# Patient Record
Sex: Female | Born: 1950 | Race: Black or African American | Hispanic: No | Smoking: Former smoker
Health system: Southern US, Community
[De-identification: ages and names within clinical notes are randomized; demographics above are authoritative.]

## PROBLEM LIST (undated history)

## (undated) DIAGNOSIS — I10 Essential (primary) hypertension: Secondary | ICD-10-CM

## (undated) DIAGNOSIS — M1990 Unspecified osteoarthritis, unspecified site: Secondary | ICD-10-CM

## (undated) DIAGNOSIS — E119 Type 2 diabetes mellitus without complications: Secondary | ICD-10-CM

---

## 2006-10-26 ENCOUNTER — Ambulatory Visit: Payer: Self-pay | Admitting: Family Medicine

## 2006-11-18 ENCOUNTER — Ambulatory Visit: Payer: Self-pay | Admitting: Family Medicine

## 2006-12-02 ENCOUNTER — Ambulatory Visit: Payer: Self-pay | Admitting: Family Medicine

## 2006-12-23 ENCOUNTER — Ambulatory Visit: Payer: Self-pay | Admitting: Family Medicine

## 2007-01-20 ENCOUNTER — Ambulatory Visit: Payer: Self-pay | Admitting: Family Medicine

## 2007-04-20 ENCOUNTER — Ambulatory Visit: Payer: Self-pay | Admitting: Family Medicine

## 2008-04-03 ENCOUNTER — Ambulatory Visit (HOSPITAL_COMMUNITY): Admission: RE | Admit: 2008-04-03 | Discharge: 2008-04-03 | Payer: Self-pay | Admitting: Family Medicine

## 2009-04-05 ENCOUNTER — Ambulatory Visit (HOSPITAL_COMMUNITY): Admission: RE | Admit: 2009-04-05 | Discharge: 2009-04-05 | Payer: Self-pay | Admitting: Family Medicine

## 2010-12-15 ENCOUNTER — Encounter: Payer: Self-pay | Admitting: Family Medicine

## 2010-12-16 ENCOUNTER — Encounter: Payer: Self-pay | Admitting: Family Medicine

## 2012-12-03 ENCOUNTER — Other Ambulatory Visit (HOSPITAL_COMMUNITY): Payer: Self-pay | Admitting: Family Medicine

## 2012-12-03 DIAGNOSIS — Z139 Encounter for screening, unspecified: Secondary | ICD-10-CM

## 2012-12-07 ENCOUNTER — Ambulatory Visit (HOSPITAL_COMMUNITY)
Admission: RE | Admit: 2012-12-07 | Discharge: 2012-12-07 | Disposition: A | Discharge status: Home / Self Care | Payer: Medicaid Other | Source: Ambulatory Visit | Attending: Family Medicine | Admitting: Family Medicine

## 2012-12-07 DIAGNOSIS — Z139 Encounter for screening, unspecified: Secondary | ICD-10-CM

## 2012-12-07 DIAGNOSIS — Z1231 Encounter for screening mammogram for malignant neoplasm of breast: Secondary | ICD-10-CM | POA: Insufficient documentation

## 2013-03-22 ENCOUNTER — Encounter (INDEPENDENT_AMBULATORY_CARE_PROVIDER_SITE_OTHER): Payer: Self-pay | Admitting: *Deleted

## 2013-03-22 ENCOUNTER — Encounter (INDEPENDENT_AMBULATORY_CARE_PROVIDER_SITE_OTHER): Payer: Self-pay

## 2013-11-29 ENCOUNTER — Other Ambulatory Visit (HOSPITAL_COMMUNITY): Payer: Self-pay | Admitting: Family Medicine

## 2013-11-29 DIAGNOSIS — Z139 Encounter for screening, unspecified: Secondary | ICD-10-CM

## 2013-12-08 ENCOUNTER — Ambulatory Visit (HOSPITAL_COMMUNITY)
Admission: RE | Admit: 2013-12-08 | Discharge: 2013-12-08 | Disposition: A | Discharge status: Home / Self Care | Payer: Medicaid Other | Source: Ambulatory Visit | Attending: Family Medicine | Admitting: Family Medicine

## 2013-12-08 DIAGNOSIS — Z1231 Encounter for screening mammogram for malignant neoplasm of breast: Secondary | ICD-10-CM | POA: Insufficient documentation

## 2013-12-08 DIAGNOSIS — Z139 Encounter for screening, unspecified: Secondary | ICD-10-CM

## 2014-05-01 DIAGNOSIS — R195 Other fecal abnormalities: Secondary | ICD-10-CM | POA: Insufficient documentation

## 2015-09-24 DIAGNOSIS — E1169 Type 2 diabetes mellitus with other specified complication: Secondary | ICD-10-CM | POA: Insufficient documentation

## 2015-09-24 DIAGNOSIS — K219 Gastro-esophageal reflux disease without esophagitis: Secondary | ICD-10-CM | POA: Insufficient documentation

## 2015-09-24 DIAGNOSIS — E785 Hyperlipidemia, unspecified: Secondary | ICD-10-CM | POA: Insufficient documentation

## 2015-09-24 DIAGNOSIS — M1991 Primary osteoarthritis, unspecified site: Secondary | ICD-10-CM | POA: Insufficient documentation

## 2015-12-31 ENCOUNTER — Encounter (HOSPITAL_COMMUNITY): Payer: Self-pay | Admitting: *Deleted

## 2015-12-31 ENCOUNTER — Emergency Department (HOSPITAL_COMMUNITY)
Admission: EM | Admit: 2015-12-31 | Discharge: 2015-12-31 | Disposition: A | Discharge status: Home / Self Care | Payer: No Typology Code available for payment source | Attending: Emergency Medicine | Admitting: Emergency Medicine

## 2015-12-31 DIAGNOSIS — E119 Type 2 diabetes mellitus without complications: Secondary | ICD-10-CM | POA: Insufficient documentation

## 2015-12-31 DIAGNOSIS — Y92410 Unspecified street and highway as the place of occurrence of the external cause: Secondary | ICD-10-CM | POA: Insufficient documentation

## 2015-12-31 DIAGNOSIS — Y998 Other external cause status: Secondary | ICD-10-CM | POA: Insufficient documentation

## 2015-12-31 DIAGNOSIS — Z7951 Long term (current) use of inhaled steroids: Secondary | ICD-10-CM | POA: Insufficient documentation

## 2015-12-31 DIAGNOSIS — S3992XA Unspecified injury of lower back, initial encounter: Secondary | ICD-10-CM | POA: Diagnosis present

## 2015-12-31 DIAGNOSIS — M1990 Unspecified osteoarthritis, unspecified site: Secondary | ICD-10-CM | POA: Insufficient documentation

## 2015-12-31 DIAGNOSIS — S39012A Strain of muscle, fascia and tendon of lower back, initial encounter: Secondary | ICD-10-CM | POA: Diagnosis not present

## 2015-12-31 DIAGNOSIS — Z7984 Long term (current) use of oral hypoglycemic drugs: Secondary | ICD-10-CM | POA: Insufficient documentation

## 2015-12-31 DIAGNOSIS — Y9389 Activity, other specified: Secondary | ICD-10-CM | POA: Insufficient documentation

## 2015-12-31 DIAGNOSIS — Z791 Long term (current) use of non-steroidal anti-inflammatories (NSAID): Secondary | ICD-10-CM | POA: Insufficient documentation

## 2015-12-31 DIAGNOSIS — R011 Cardiac murmur, unspecified: Secondary | ICD-10-CM | POA: Diagnosis not present

## 2015-12-31 DIAGNOSIS — Z79899 Other long term (current) drug therapy: Secondary | ICD-10-CM | POA: Diagnosis not present

## 2015-12-31 DIAGNOSIS — I10 Essential (primary) hypertension: Secondary | ICD-10-CM | POA: Diagnosis not present

## 2015-12-31 HISTORY — DX: Unspecified osteoarthritis, unspecified site: M19.90

## 2015-12-31 HISTORY — DX: Type 2 diabetes mellitus without complications: E11.9

## 2015-12-31 HISTORY — DX: Essential (primary) hypertension: I10

## 2015-12-31 MED ORDER — METHOCARBAMOL 500 MG PO TABS: ORAL_TABLET | ORAL | Status: DC

## 2015-12-31 MED ORDER — HYDROCODONE-ACETAMINOPHEN 5-325 MG PO TABS: ORAL_TABLET | ORAL | Status: DC
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
## 2015-12-31 NOTE — ED Notes (Signed)
Pt verbalized understanding of no driving and to use caution within 4 hours of taking pain meds due to meds cause drowsiness 
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
## 2016-02-18 ENCOUNTER — Other Ambulatory Visit (HOSPITAL_COMMUNITY): Payer: Self-pay | Admitting: Physician Assistant

## 2016-02-18 ENCOUNTER — Ambulatory Visit (HOSPITAL_COMMUNITY)
Admission: RE | Admit: 2016-02-18 | Discharge: 2016-02-18 | Disposition: A | Discharge status: Home / Self Care | Payer: Medicaid Other | Source: Ambulatory Visit | Attending: Physician Assistant | Admitting: Physician Assistant

## 2016-02-18 DIAGNOSIS — M47896 Other spondylosis, lumbar region: Secondary | ICD-10-CM | POA: Insufficient documentation

## 2016-02-18 DIAGNOSIS — M5442 Lumbago with sciatica, left side: Secondary | ICD-10-CM | POA: Diagnosis not present

## 2016-02-18 DIAGNOSIS — M5441 Lumbago with sciatica, right side: Secondary | ICD-10-CM

## 2016-02-19 DIAGNOSIS — Z1211 Encounter for screening for malignant neoplasm of colon: Secondary | ICD-10-CM | POA: Insufficient documentation

## 2016-03-09 ENCOUNTER — Emergency Department (HOSPITAL_COMMUNITY): Payer: Medicaid Other

## 2016-03-09 ENCOUNTER — Encounter (HOSPITAL_COMMUNITY): Payer: Self-pay | Admitting: Emergency Medicine

## 2016-03-09 ENCOUNTER — Emergency Department (HOSPITAL_COMMUNITY)
Admission: EM | Admit: 2016-03-09 | Discharge: 2016-03-09 | Disposition: A | Discharge status: Home / Self Care | Payer: Medicaid Other | Attending: Emergency Medicine | Admitting: Emergency Medicine

## 2016-03-09 DIAGNOSIS — M1990 Unspecified osteoarthritis, unspecified site: Secondary | ICD-10-CM | POA: Diagnosis not present

## 2016-03-09 DIAGNOSIS — Z7984 Long term (current) use of oral hypoglycemic drugs: Secondary | ICD-10-CM | POA: Diagnosis not present

## 2016-03-09 DIAGNOSIS — R1032 Left lower quadrant pain: Secondary | ICD-10-CM | POA: Diagnosis present

## 2016-03-09 DIAGNOSIS — E119 Type 2 diabetes mellitus without complications: Secondary | ICD-10-CM | POA: Insufficient documentation

## 2016-03-09 DIAGNOSIS — Z79899 Other long term (current) drug therapy: Secondary | ICD-10-CM | POA: Diagnosis not present

## 2016-03-09 DIAGNOSIS — I10 Essential (primary) hypertension: Secondary | ICD-10-CM | POA: Insufficient documentation

## 2016-03-09 LAB — URINALYSIS, ROUTINE W REFLEX MICROSCOPIC
BILIRUBIN URINE: NEGATIVE
Glucose, UA: NEGATIVE mg/dL
Ketones, ur: NEGATIVE mg/dL
Leukocytes, UA: NEGATIVE
NITRITE: NEGATIVE
PH: 5.5 (ref 5.0–8.0)
PROTEIN: NEGATIVE mg/dL

## 2016-03-09 LAB — CBC
HEMATOCRIT: 37 % (ref 36.0–46.0)
HEMOGLOBIN: 12.2 g/dL (ref 12.0–15.0)
MCH: 27.9 pg (ref 26.0–34.0)
MCHC: 33 g/dL (ref 30.0–36.0)
MCV: 84.7 fL (ref 78.0–100.0)
Platelets: 232 10*3/uL (ref 150–400)
RBC: 4.37 MIL/uL (ref 3.87–5.11)
RDW: 13.4 % (ref 11.5–15.5)
WBC: 5.1 10*3/uL (ref 4.0–10.5)

## 2016-03-09 LAB — URINE MICROSCOPIC-ADD ON: WBC UA: NONE SEEN WBC/hpf (ref 0–5)

## 2016-03-09 LAB — LIPASE, BLOOD: Lipase: 26 U/L (ref 11–51)

## 2016-03-09 LAB — I-STAT CHEM 8, ED
BUN: 23 mg/dL — ABNORMAL HIGH (ref 6–20)
CHLORIDE: 105 mmol/L (ref 101–111)
Calcium, Ion: 1.27 mmol/L (ref 1.13–1.30)
Creatinine, Ser: 0.8 mg/dL (ref 0.44–1.00)
Glucose, Bld: 128 mg/dL — ABNORMAL HIGH (ref 65–99)
HEMATOCRIT: 42 % (ref 36.0–46.0)
Hemoglobin: 14.3 g/dL (ref 12.0–15.0)
POTASSIUM: 3.9 mmol/L (ref 3.5–5.1)
SODIUM: 143 mmol/L (ref 135–145)
TCO2: 25 mmol/L (ref 0–100)

## 2016-03-09 LAB — COMPREHENSIVE METABOLIC PANEL
ALBUMIN: 4.1 g/dL (ref 3.5–5.0)
ALT: 28 U/L (ref 14–54)
ANION GAP: 7 (ref 5–15)
AST: 28 U/L (ref 15–41)
Alkaline Phosphatase: 50 U/L (ref 38–126)
BUN: 23 mg/dL — ABNORMAL HIGH (ref 6–20)
CHLORIDE: 108 mmol/L (ref 101–111)
CO2: 26 mmol/L (ref 22–32)
Calcium: 9.4 mg/dL (ref 8.9–10.3)
Creatinine, Ser: 0.8 mg/dL (ref 0.44–1.00)
GFR calc Af Amer: >60 mL/min (ref >60)
GFR calc non Af Amer: >60 mL/min (ref >60)
GLUCOSE: 137 mg/dL — AB (ref 65–99)
POTASSIUM: 3.9 mmol/L (ref 3.5–5.1)
SODIUM: 141 mmol/L (ref 135–145)
Total Bilirubin: 0.4 mg/dL (ref 0.3–1.2)
Total Protein: 7.5 g/dL (ref 6.5–8.1)

## 2016-03-09 MED ORDER — DIATRIZOATE MEGLUMINE & SODIUM 66-10 % PO SOLN
ORAL | Status: AC
  Administered 2016-03-09: 30 mL
  Filled 2016-03-09: qty 30

## 2016-03-09 MED ORDER — KETOROLAC TROMETHAMINE 60 MG/2ML IM SOLN
30.0000 mg | Freq: Once | INTRAMUSCULAR | Status: DC
  Filled 2016-03-09: qty 2

## 2016-03-09 MED ORDER — IOPAMIDOL (ISOVUE-300) INJECTION 61%
100.0000 mL | Freq: Once | INTRAVENOUS | Status: AC | PRN
  Administered 2016-03-09: 100 mL via INTRAVENOUS

## 2016-03-09 MED ORDER — KETOROLAC TROMETHAMINE 30 MG/ML IJ SOLN
15.0000 mg | Freq: Once | INTRAMUSCULAR | Status: AC
  Administered 2016-03-09: 15 mg via INTRAVENOUS

## 2016-03-09 NOTE — ED Provider Notes (Signed)
CSN: [id number]     Arrival date & time 03/09/16  1403 History   First MD Initiated Contact with Patient 03/09/16 1410     Chief Complaint  Patient presents with  . Abdominal Pain     (Consider location/radiation/quality/duration/timing/severity/associated sxs/prior Treatment) Patient is a 65 y.o. female presenting with abdominal pain.  Abdominal Pain Pain location:  LLQ Pain quality: aching and sharp   Pain radiates to:  Suprapubic region Pain severity:  Mild Onset quality:  Gradual Duration:  1 week Timing:  Constant Progression:  Worsening Chronicity:  New Relieved by:  Not moving Worsened by:  Movement Ineffective treatments:  None tried Associated symptoms: constipation and dysuria   Associated symptoms: no cough and no shortness of breath     Past Medical History  Diagnosis Date  . Hypertension   . Diabetes mellitus without complication (HCC)   . Arthritis    History reviewed. No pertinent past surgical history. History reviewed. No pertinent family history. Social History  Substance Use Topics  . Smoking status: Never Smoker   . Smokeless tobacco: None  . Alcohol Use: No   OB History    No data available     Review of Systems  Respiratory: Negative for cough and shortness of breath.   Gastrointestinal: Positive for abdominal pain and constipation.  Genitourinary: Positive for dysuria.  All other systems reviewed and are negative.     Allergies  Review of patient's allergies indicates no known allergies.  Home Medications   Prior to Admission medications   Medication Sig Start Date End Date Taking? Authorizing Provider  acetaminophen (TYLENOL) 500 MG tablet Take 500 mg by mouth every 6 (six) hours as needed for mild pain or moderate pain.   Yes Historical Provider, MD  amLODipine (NORVASC) 5 MG tablet Take 5 mg by mouth daily.  09/24/15  Yes Historical Provider, MD  atorvastatin (LIPITOR) 20 MG tablet Take 20 mg by mouth every evening.  09/24/15   Yes Historical Provider, MD  colchicine 0.6 MG tablet Take 0.6 mg by mouth daily.  09/24/15  Yes Historical Provider, MD  fluticasone (FLONASE) 50 MCG/ACT nasal spray Place into the nose daily.    Yes Historical Provider, MD  lisinopril-hydrochlorothiazide (PRINZIDE,ZESTORETIC) 20-25 MG tablet Take 1 tablet by mouth daily.  09/24/15  Yes Historical Provider, MD  meloxicam (MOBIC) 7.5 MG tablet Take 7.5 mg by mouth daily.  09/24/15  Yes Historical Provider, MD  metFORMIN (GLUCOPHAGE-XR) 500 MG 24 hr tablet Take 500 mg by mouth daily with breakfast.  09/24/15  Yes Historical Provider, MD  methocarbamol (ROBAXIN) 500 MG tablet 1 at hs or three times daily for spasm pain Patient taking differently: Take 500 mg by mouth at bedtime.  12/31/15  Yes Ivery Quale, PA-C  metoprolol succinate (TOPROL-XL) 25 MG 24 hr tablet Take 25 mg by mouth daily.  09/24/15  Yes Historical Provider, MD  omeprazole (PRILOSEC) 20 MG capsule Take 20 mg by mouth daily.  09/24/15  Yes Historical Provider, MD   BP 180/99 mmHg  Pulse 74  Temp(Src) 97.7 F (36.5 C) (Oral)  Resp 16  Ht  (1.6 m)  Wt 220 lb (99.791 kg)  BMI 38.98 kg/m2  SpO2 100% Physical Exam  Constitutional: She is oriented to person, place, and time. She appears well-developed and well-nourished.  HENT:  Head: Normocephalic and atraumatic.  Neck: Normal range of motion.  Cardiovascular: Normal rate and regular rhythm.  Exam reveals no friction rub.   No murmur heard. Pulmonary/Chest:  No stridor. No respiratory distress.  Abdominal: She exhibits no distension. There is tenderness (llq).  Musculoskeletal: Normal range of motion. She exhibits no edema or tenderness.  Neurological: She is alert and oriented to person, place, and time.  Skin: Skin is warm and dry.  Nursing note and vitals reviewed.   ED Course  Procedures (including critical care time) Labs Review Labs Reviewed  COMPREHENSIVE METABOLIC PANEL - Abnormal; Notable for the  following:    Glucose, Bld 137 (*)    BUN 23 (*)    All other components within normal limits  URINALYSIS, ROUTINE W REFLEX MICROSCOPIC (NOT [facility]) - Abnormal; Notable for the following:    Specific Gravity, Urine >1.030 (*)    Hgb urine dipstick TRACE (*)    All other components within normal limits  URINE MICROSCOPIC-ADD ON - Abnormal; Notable for the following:    Squamous Epithelial / LPF 6-30 (*)    Bacteria, UA RARE (*)    All other components within normal limits  I-STAT CHEM 8, ED - Abnormal; Notable for the following:    BUN 23 (*)    Glucose, Bld 128 (*)    All other components within normal limits  LIPASE, BLOOD  CBC    Imaging Review Ct Abdomen Pelvis W Contrast  03/09/2016  CLINICAL DATA:  Lower left-sided abdominal pain for 3 days. Dysuria. Hypertension and diabetes. EXAM: CT ABDOMEN AND PELVIS WITH CONTRAST TECHNIQUE: Multidetector CT imaging of the abdomen and pelvis was performed using the standard protocol following bolus administration of intravenous contrast. CONTRAST:  100mL ISOVUE-300 IOPAMIDOL (ISOVUE-300) INJECTION 61% COMPARISON:  None. FINDINGS: Lower chest: Clear lung bases. Mild cardiomegaly, without pericardial or pleural effusion. Hepatobiliary: Hepatic steatosis, without focal liver lesion. Normal gallbladder, without biliary ductal dilatation. Pancreas: Normal, without mass or ductal dilatation. Spleen: Normal in size, without focal abnormality. Adrenals/Urinary Tract: Normal adrenal glands. Normal kidneys, without hydronephrosis. Normal urinary bladder. Stomach/Bowel: Normal stomach, without Kmetz thickening. Scattered colonic diverticula. Normal terminal ileum and appendix. Normal small bowel. Vascular/Lymphatic: Normal caliber of the aorta and branch vessels. No abdominopelvic adenopathy. Reproductive: Multiple uterine fibroids. Example within the posterior uterine body at 3.8 cm. Limited evaluation for adnexal mass secondary to exophytic fibroids. Other: No  significant free fluid. Musculoskeletal: No acute osseous abnormality. Thoracolumbar junction degenerative disc disease. IMPRESSION: 1.  No acute process in the abdomen or pelvis. 2. Uterine fibroids. 3. Hepatic steatosis. Electronically Signed   By: Jeronimo GreavesKyle  Talbot M.D.   On: 03/09/2016 17:14   I have personally reviewed and evaluated these images and lab results as part of my medical decision-making.   EKG Interpretation None      MDM   Final diagnoses:  Left lower quadrant pain    Workup negative. Pain improved. Possibly muscular in nature. However without pain now, will not rx outpatient meds and ask her to fu w/ pcp if worsens again.   New Prescriptions: Discharge Medication List as of 03/09/2016  5:57 PM       I have personally and contemperaneously reviewed labs and imaging and used in my decision making as above.   A medical screening exam was performed and I feel the patient has had an appropriate workup for their chief complaint at this time and likelihood of emergent condition existing is low. Their vital signs are stable. They have been counseled on decision, discharge, follow up and which symptoms necessitate immediate return to the emergency department.  They verbally stated understanding and agreement with plan and  discharged in stable condition.    Marily Memos, MD 03/09/16 2227

## 2016-03-09 NOTE — ED Notes (Signed)
Pt presents to ED with complaints of lower left abdominal pain x 3 days.  States that burning is noted when urinating.  Denies N/V/D.

## 2016-03-27 DIAGNOSIS — Z1239 Encounter for other screening for malignant neoplasm of breast: Secondary | ICD-10-CM | POA: Insufficient documentation

## 2016-03-27 DIAGNOSIS — E1169 Type 2 diabetes mellitus with other specified complication: Secondary | ICD-10-CM | POA: Insufficient documentation

## 2016-03-28 ENCOUNTER — Other Ambulatory Visit (HOSPITAL_COMMUNITY): Payer: Self-pay | Admitting: Adult Health Nurse Practitioner

## 2016-03-28 DIAGNOSIS — Z1231 Encounter for screening mammogram for malignant neoplasm of breast: Secondary | ICD-10-CM

## 2016-03-31 ENCOUNTER — Ambulatory Visit (HOSPITAL_COMMUNITY)
Admission: RE | Admit: 2016-03-31 | Discharge: 2016-03-31 | Disposition: A | Discharge status: Home / Self Care | Payer: Medicaid Other | Source: Ambulatory Visit | Attending: Adult Health Nurse Practitioner | Admitting: Adult Health Nurse Practitioner

## 2016-03-31 DIAGNOSIS — Z1231 Encounter for screening mammogram for malignant neoplasm of breast: Secondary | ICD-10-CM | POA: Diagnosis present

## 2017-04-14 ENCOUNTER — Other Ambulatory Visit (HOSPITAL_COMMUNITY): Payer: Self-pay | Admitting: Physician Assistant

## 2017-04-14 DIAGNOSIS — Z1231 Encounter for screening mammogram for malignant neoplasm of breast: Secondary | ICD-10-CM

## 2017-04-16 IMAGING — MG MM DIGITAL SCREENING
6 of 10 series · 6 of 26 positions shown · non-contrast
Comparison: Previous exam(s).

CLINICAL DATA: Screening.

EXAM:
2D DIGITAL SCREENING BILATERAL MAMMOGRAM WITH CAD AND ADJUNCT TOMO

[R MLO (1 of 2)]
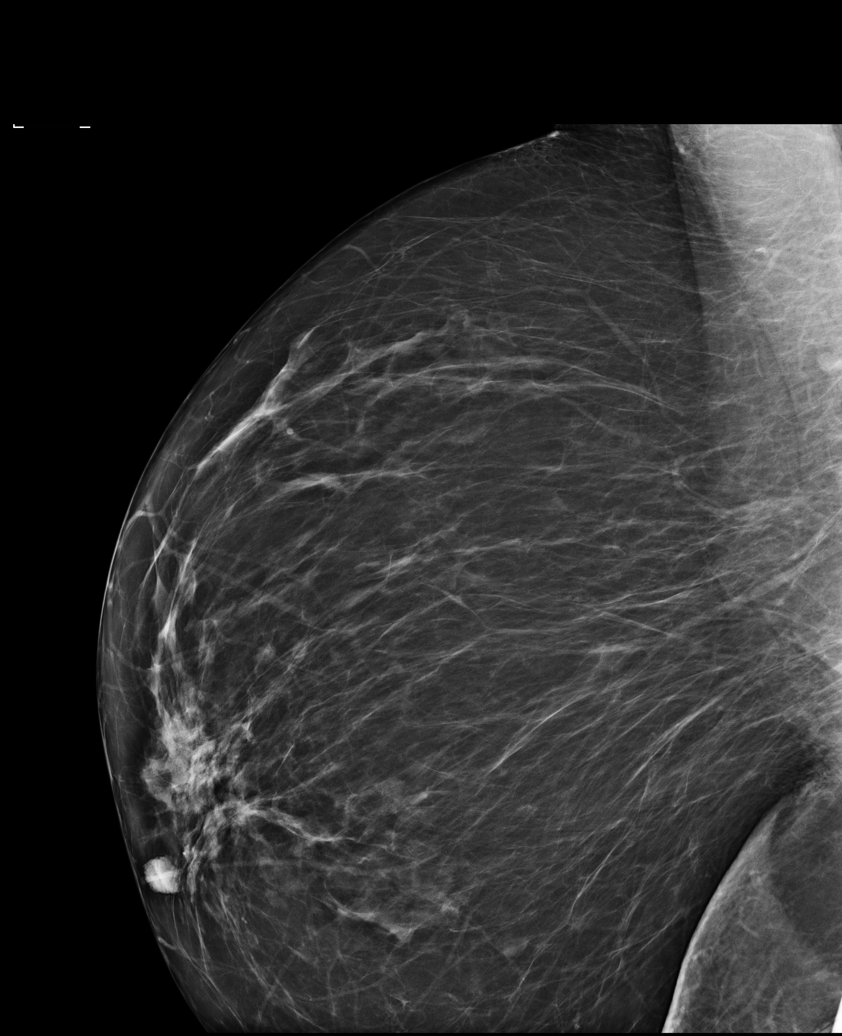

[L MLO (1 of 2)]
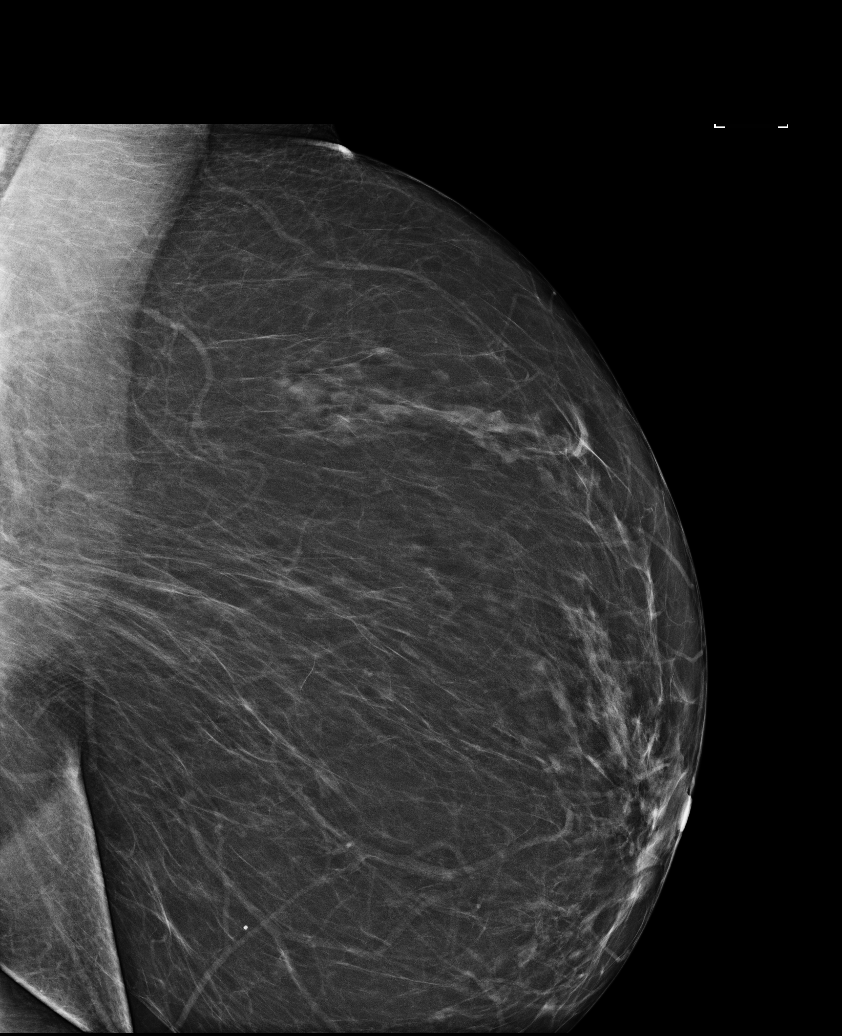

[R CC]
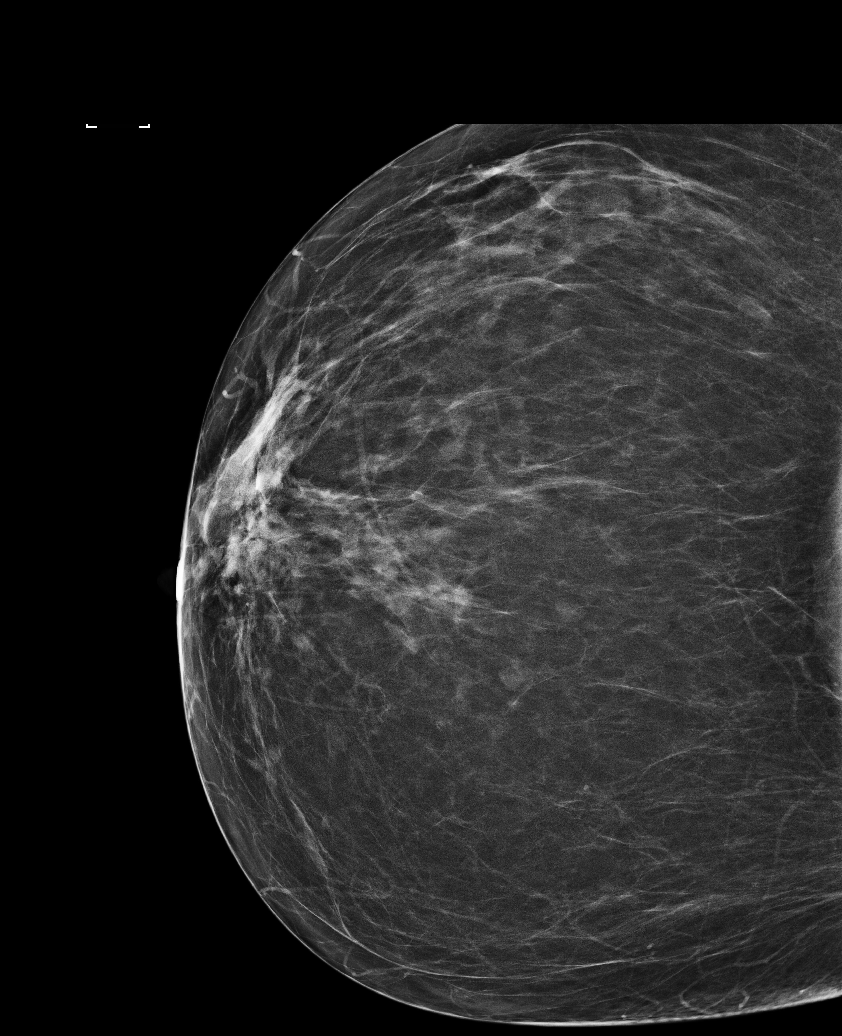

[L CC]
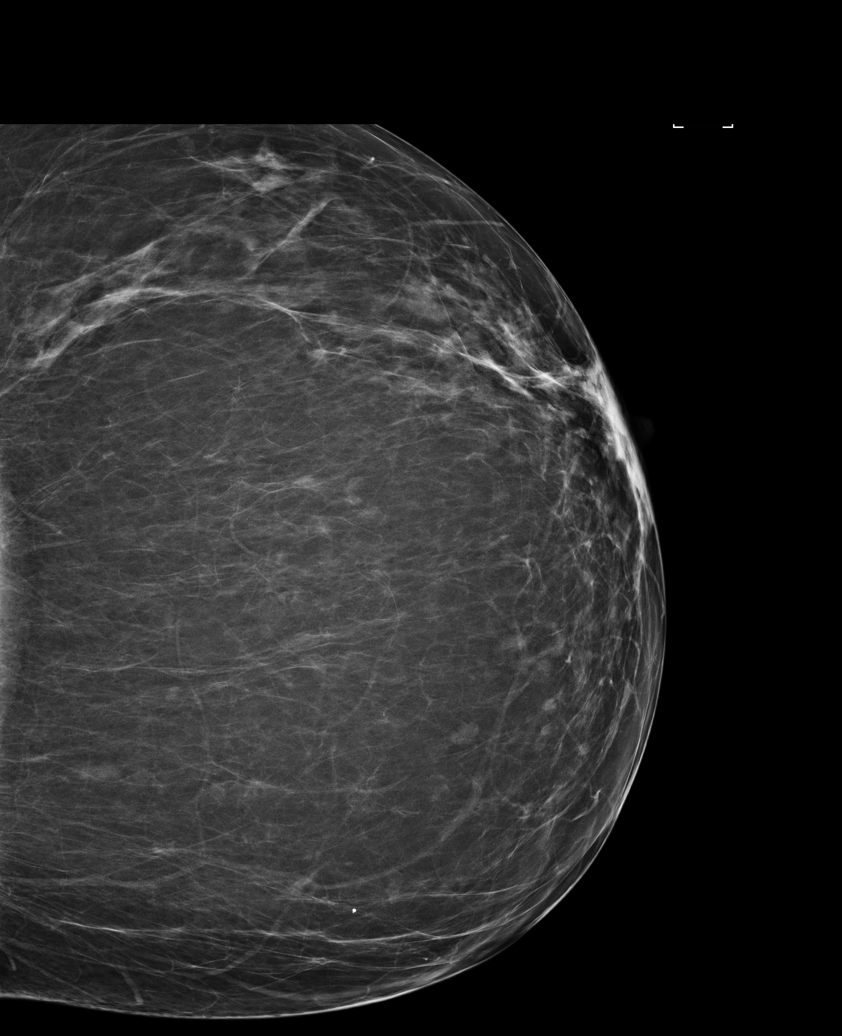

[R MLO (2 of 2)]
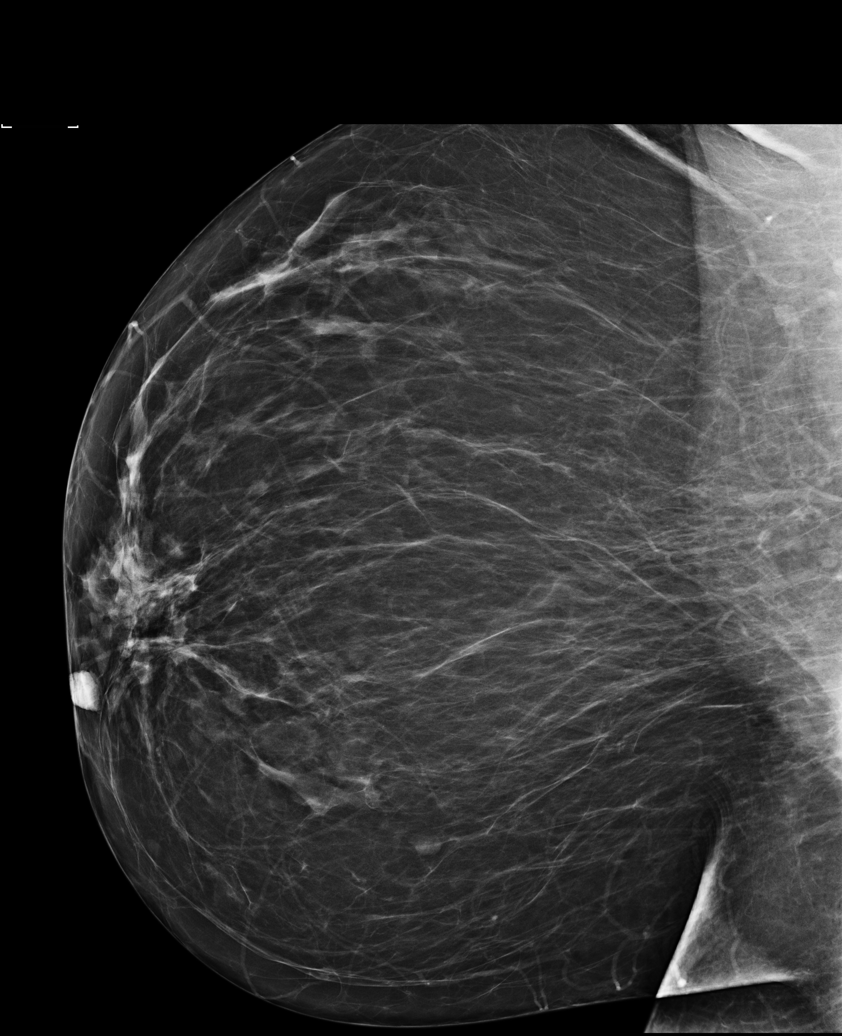

[L MLO (2 of 2)]
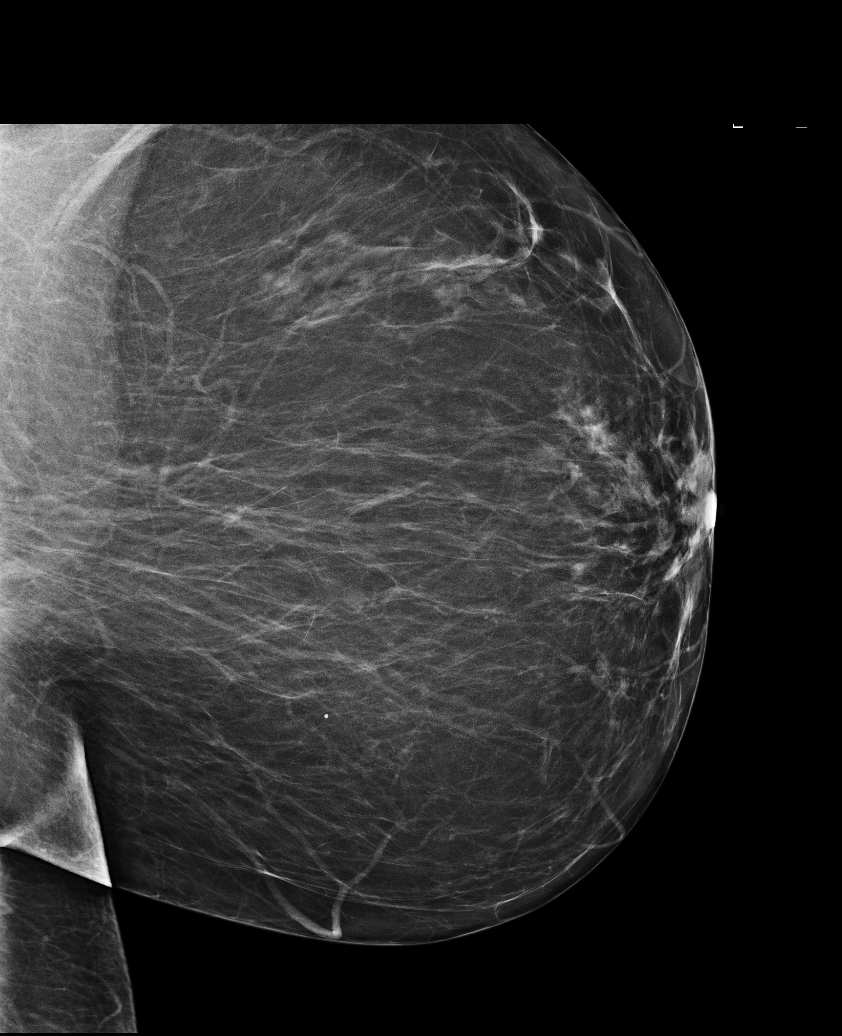

[6 of 26 positions shown; findings below may reference images not displayed]

ACR Breast Density Category b: There are scattered areas of
fibroglandular density.
FINDINGS: There are no findings suspicious for malignancy. Images were
processed with CAD.
IMPRESSION: No mammographic evidence of malignancy. A result letter of this
screening mammogram will be mailed directly to the patient.

RECOMMENDATION:
Screening mammogram in one year. (Code:97-6-RS4)

BI-RADS CATEGORY  1: Negative.

## 2017-04-23 ENCOUNTER — Ambulatory Visit (HOSPITAL_COMMUNITY): Payer: Medicaid Other

## 2017-05-06 ENCOUNTER — Ambulatory Visit (HOSPITAL_COMMUNITY)
Admission: RE | Admit: 2017-05-06 | Discharge: 2017-05-06 | Disposition: A | Discharge status: Home / Self Care | Payer: Medicare Other | Source: Ambulatory Visit | Attending: Physician Assistant | Admitting: Physician Assistant

## 2017-05-06 DIAGNOSIS — Z1231 Encounter for screening mammogram for malignant neoplasm of breast: Secondary | ICD-10-CM | POA: Insufficient documentation

## 2017-07-06 NOTE — Congregational Nurse Program (Signed)
Congregational Nurse Program Note  Date of Encounter: 07/02/2017  Past Medical History: Past Medical History:  Diagnosis Date  . Arthritis   . Diabetes mellitus without complication (HCC)   . Hypertension     Encounter Details:     CNP Questionnaire - 07/02/17 1000      Patient Demographics   Is this a new or existing patient? New   Patient is considered a/an Not Applicable   Race African-American/Black     Patient Assistance   Location of Patient Assistance [facility], [place]   Patient's financial/insurance status Medicaid;Medicare   Uninsured Patient ([place] [employer]) No   Patient referred to apply for the following financial assistance Not Applicable   Food insecurities addressed Referred to food bank or resource   Transportation assistance No   Assistance securing medications No   Educational health offerings Hypertension     Encounter Details   Primary purpose of visit Education/Health Concerns;Chronic Illness/Condition Visit;Navigating the Healthcare System   Was an Emergency Department visit averted? Not Applicable   Does patient have a medical provider? Yes   Patient referred to Follow up with established PCP   Was a mental health screening completed? (GAINS tool) No   Does patient have dental issues? No   Does patient have vision issues? No   Was a vision referral made? No   Does your patient have an abnormal blood pressure today? Yes   Since previous encounter, have you referred patient for abnormal blood pressure that resulted in a new diagnosis or medication change? No   Does your patient have an abnormal blood glucose today? No   Since previous encounter, have you referred patient for abnormal blood glucose that resulted in a new diagnosis or medication change? No   Was there a life-saving intervention made? No      Encountered client today during a blood pressure screening event. Client states she does take medication for hypertension  and that she has a primary care provider in Dr. [place]beth Gal[place]. Client encouraged to follow up with her primary medical provider and to take medications as prescribed by her medical provider. Client states understanding and will follow up with her medical provider.

## 2017-07-14 DIAGNOSIS — I2510 Atherosclerotic heart disease of native coronary artery without angina pectoris: Secondary | ICD-10-CM | POA: Insufficient documentation

## 2017-11-27 DIAGNOSIS — M109 Gout, unspecified: Secondary | ICD-10-CM

## 2017-11-27 HISTORY — DX: Gout, unspecified: M10.9

## 2018-03-29 ENCOUNTER — Other Ambulatory Visit (HOSPITAL_COMMUNITY): Payer: Self-pay | Admitting: Family Medicine

## 2018-03-29 ENCOUNTER — Other Ambulatory Visit (HOSPITAL_COMMUNITY): Payer: Self-pay | Admitting: Adult Health Nurse Practitioner

## 2018-03-29 DIAGNOSIS — Z780 Asymptomatic menopausal state: Secondary | ICD-10-CM | POA: Insufficient documentation

## 2018-03-29 DIAGNOSIS — Z1231 Encounter for screening mammogram for malignant neoplasm of breast: Secondary | ICD-10-CM

## 2018-04-07 ENCOUNTER — Ambulatory Visit (HOSPITAL_COMMUNITY)
Admission: RE | Admit: 2018-04-07 | Discharge: 2018-04-07 | Disposition: A | Discharge status: Home / Self Care | Payer: Medicare Other | Source: Ambulatory Visit | Attending: Adult Health Nurse Practitioner | Admitting: Adult Health Nurse Practitioner

## 2018-04-07 DIAGNOSIS — Z780 Asymptomatic menopausal state: Secondary | ICD-10-CM | POA: Diagnosis present

## 2018-05-10 ENCOUNTER — Ambulatory Visit (HOSPITAL_COMMUNITY)
Admission: RE | Admit: 2018-05-10 | Discharge: 2018-05-10 | Disposition: A | Discharge status: Home / Self Care | Payer: Medicare Other | Source: Ambulatory Visit | Attending: Family Medicine | Admitting: Family Medicine

## 2018-05-10 DIAGNOSIS — Z1231 Encounter for screening mammogram for malignant neoplasm of breast: Secondary | ICD-10-CM | POA: Diagnosis present

## 2018-07-01 DIAGNOSIS — R748 Abnormal levels of other serum enzymes: Secondary | ICD-10-CM

## 2018-07-01 DIAGNOSIS — R5382 Chronic fatigue, unspecified: Secondary | ICD-10-CM | POA: Insufficient documentation

## 2018-07-01 HISTORY — DX: Abnormal levels of other serum enzymes: R74.8

## 2019-06-23 ENCOUNTER — Other Ambulatory Visit: Payer: Self-pay

## 2019-06-24 ENCOUNTER — Ambulatory Visit (INDEPENDENT_AMBULATORY_CARE_PROVIDER_SITE_OTHER): Payer: Medicare Other | Admitting: Physician Assistant

## 2019-06-24 ENCOUNTER — Encounter: Payer: Self-pay | Admitting: Physician Assistant

## 2019-06-24 VITALS — BP 139/84 | HR 67 | Temp 96.8°F | Ht 63.0 in | Wt 218.0 lb

## 2019-06-24 DIAGNOSIS — E785 Hyperlipidemia, unspecified: Secondary | ICD-10-CM

## 2019-06-24 DIAGNOSIS — E1169 Type 2 diabetes mellitus with other specified complication: Secondary | ICD-10-CM

## 2019-06-24 DIAGNOSIS — M1000 Idiopathic gout, unspecified site: Secondary | ICD-10-CM | POA: Diagnosis not present

## 2019-06-24 DIAGNOSIS — R011 Cardiac murmur, unspecified: Secondary | ICD-10-CM | POA: Diagnosis not present

## 2019-06-24 DIAGNOSIS — I10 Essential (primary) hypertension: Secondary | ICD-10-CM

## 2019-06-24 LAB — BAYER DCA HB A1C WAIVED: HB A1C (BAYER DCA - WAIVED): 8 % — ABNORMAL HIGH (ref <7.0)

## 2019-06-24 MED ORDER — METOPROLOL TARTRATE 50 MG PO TABS: 50.0000 mg | ORAL_TABLET | Freq: Two times a day (BID) | ORAL | 1 refills | Status: DC

## 2019-06-24 MED ORDER — ATORVASTATIN CALCIUM 20 MG PO TABS: 20.0000 mg | ORAL_TABLET | Freq: Every evening | ORAL | 1 refills | Status: DC

## 2019-06-24 MED ORDER — LISINOPRIL-HYDROCHLOROTHIAZIDE 20-25 MG PO TABS: 1.0000 | ORAL_TABLET | Freq: Every day | ORAL | 1 refills | Status: DC

## 2019-06-24 MED ORDER — GABAPENTIN 300 MG PO CAPS: 300.0000 mg | ORAL_CAPSULE | Freq: Three times a day (TID) | ORAL | 1 refills | Status: DC

## 2019-06-24 MED ORDER — OMEPRAZOLE 20 MG PO CPDR: 20.0000 mg | DELAYED_RELEASE_CAPSULE | Freq: Every day | ORAL | 1 refills | Status: DC

## 2019-06-24 MED ORDER — AMLODIPINE BESYLATE 5 MG PO TABS: 5.0000 mg | ORAL_TABLET | Freq: Every day | ORAL | 1 refills | Status: DC

## 2019-06-24 NOTE — Progress Notes (Signed)
  Subjective:     Patient ID: Sheena Graham, female   DOB: [birth year], 68 y.o.   MRN: [id number]  HPI Pt here to establish care Prev pt of Dr Lolita Rieger PMH of gout,HTN, NIDDM, and hyperlipid States she has been doing well Denies any CP, SOB, weight change, or endurance changes Tolerating meds well No worries or concerns at this time  Review of Systems  Constitutional: Negative.   Respiratory: Negative.   Cardiovascular: Negative.   Gastrointestinal: Negative.   Endocrine: Negative.   Psychiatric/Behavioral: Negative.        Objective:   Physical Exam Vitals signs and nursing note reviewed.  Constitutional:      General: She is not in acute distress.    Appearance: Normal appearance. She is not ill-appearing.  Neck:     Musculoskeletal: Neck supple.     Vascular: Carotid bruit present.  Cardiovascular:     Rate and Rhythm: Normal rate and regular rhythm.     Pulses: Normal pulses.     Heart sounds: Murmur present.     Comments: Murmur 2/6 radiates to the R carotid Pulmonary:     Effort: Pulmonary effort is normal.     Breath sounds: Normal breath sounds.  Lymphadenopathy:     Cervical: No cervical adenopathy.  Skin:    General: Skin is warm and dry.  Neurological:     Mental Status: She is alert.  Psychiatric:        Mood and Affect: Mood normal.        Behavior: Behavior normal.        Thought Content: Thought content normal.        Judgment: Judgment normal.   When questioned abut prev hx of murmur pt sates she was told she " might be developing one" A1C 8    Assessment:     1. 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 Murmur   3. Essential hypertension   4. Hyperlipidemia associated with type 2 diabetes mellitus ([place])   5. Idiopathic gout, unspecified chronicity, unspecified site        Plan:     Referral for echo done today to eval new murmur Good diet reviewed Stressed hydration due to gout hx Continue all meds Pt is due yrly eye  exam She is doing nightly foot checks and wearing shoes at all times Pt left before getting A1C results - will stress diet but no change in meds F/U pending echo

## 2019-06-24 NOTE — Patient Instructions (Signed)
Heart Murmur A heart murmur is an extra sound that is caused by chaotic blood flow through the valves of the heart. The murmur can be heard as a "hum" or "whoosh" sound when blood flows through the heart. There are two types of heart murmurs:  Innocent (benign) murmurs. Most people with this type of heart murmur do not have a heart problem. Many children have innocent heart murmurs. Your health care provider may suggest some basic tests to find out whether your murmur is an innocent murmur. If an innocent heart murmur is found, there is no need for further tests or treatment and no need to restrict activities or stop playing sports.  Abnormal murmurs. These types of murmurs can occur in children and adults. Abnormal murmurs may be a sign of a more serious heart condition, such as a heart defect present at birth (congenital defect) or heart valve disease. What are the causes?  The heart has four areas called chambers. Valves separate the upper and lower chambers from each other (tricuspid valve and mitral valve) and separate the lower chambers of the heart from pathways that lead away from the heart (aortic valve and pulmonary valve). Normally, the valves open to let blood flow through or out of your heart, and then they shut to keep the blood from flowing backward. This condition is caused by heart valves that are not working properly.  In children, abnormal heart murmurs are typically caused by congenital defects.  In adults, abnormal murmurs are usually caused by heart valve problems from disease, infection, or aging. This condition may also be caused by:  Pregnancy.  Fever.  Overactive thyroid gland.  Anemia.  Exercise.  Rapid growth spurts (in children). What are the signs or symptoms? Innocent murmurs do not cause symptoms, and many people with abnormal murmurs may not have symptoms. If symptoms do develop, they may include:  Shortness of breath.  Blue coloring of the skin,  especially on the fingertips.  Chest pain.  Palpitations, or feeling a fluttering or skipped heartbeat.  Fainting.  Persistent cough.  Getting tired much faster than expected.  Swelling in the abdomen, feet, or ankles. How is this diagnosed? This condition may be diagnosed during a routine physical or other exam. If your health care provider hears a murmur with a stethoscope, he or she will listen for:  Where the murmur is located in your heart.  How long the murmur lasts (duration).  When the murmur is heard during the heartbeat.  How loud the murmur is. This may help the health care provider figure out what is causing the murmur. You may be referred to a heart specialist (cardiologist). You may also have other tests, including:  Electrocardiogram (ECG or EKG). This test measures the electrical activity of your heart.  Echocardiogram. This test uses high frequency sound waves to make pictures of your heart.  MRI or chest X-ray.  Cardiac catheterization. This test looks at blood flow through the arteries around the heart. For children and adults who have an abnormal heart murmur and want to stay active, it is important to:  Complete testing.  Review test results.  Receive recommendations from your health care provider. If heart disease is present, it may not be safe to play or be active. How is this treated? Heart murmurs themselves do not need treatment. In some cases, a heart murmur may go away on its own. If an underlying problem or disease is causing the murmur, you may need treatment. If treatment  is needed, it will depend on the type and severity of the disease or heart problem causing the murmur. Treatment may include:  Medicine.  Surgery.  Dietary and lifestyle changes. Follow these instructions at home:  Talk with your health care provider before participating in sports or other activities that require a lot of effort and energy (are strenuous).  Learn as  much as possible about your condition and any related diseases. Ask your health care provider if you may be at risk for any medical emergencies.  Talk with your health care provider about what symptoms you should look out for.  It is up to you to get your test results. Ask your health care provider, or the department that is doing the test, when your results will be ready.  Keep all follow-up visits as told by your health care provider. This is important. Contact a health care provider if:  You are frequently short of breath.  You feel more tired than usual.  You are having a hard time keeping up with normal activities or fitness routines.  You have swelling in your ankles or feet.  You notice that your heart often beats irregularly.  You develop any new symptoms. Get help right away if:  You have chest pain.  You are having trouble breathing.  You feel light-headed or you pass out.  Your symptoms suddenly get worse. These symptoms may represent a serious problem that is an emergency. Do not wait to see if the symptoms will go away. Get medical help right away. Call your local emergency services (911 in the [place] Do not drive yourself to the hospital. Summary  Normally, the heart valves open to let blood flow through or out of your heart, and then they shut to keep the blood from flowing backward.  A heart murmur is caused by heart valves that are not working properly.  You may need treatment if an underlying problem or disease is causing the heart murmur. Treatment may include medicine, surgery, or dietary and lifestyle changes.  Talk with your health care provider before participating in sports or other activities that require a lot of effort and energy (are strenuous).  Talk with your health care provider about what symptoms you should watch out for. This information is not intended to replace advice given to you by your health care provider. Make sure you discuss any  questions you have with your health care provider. Document Released: 12/18/2004 Document Revised: 05/04/2018 Document Reviewed: 05/04/2018 Elsevier Patient Education  2020 [employer].

## 2019-07-06 ENCOUNTER — Ambulatory Visit (HOSPITAL_COMMUNITY)
Admission: RE | Admit: 2019-07-06 | Discharge: 2019-07-06 | Disposition: A | Discharge status: Home / Self Care | Payer: Medicare Other | Source: Ambulatory Visit | Attending: Physician Assistant | Admitting: Physician Assistant

## 2019-07-06 ENCOUNTER — Other Ambulatory Visit: Payer: Self-pay

## 2019-07-06 DIAGNOSIS — R011 Cardiac murmur, unspecified: Secondary | ICD-10-CM | POA: Insufficient documentation

## 2019-07-06 NOTE — Progress Notes (Signed)
*  PRELIMINARY RESULTS* Echocardiogram 2D Echocardiogram has been performed.  Leavy Cella 07/06/2019, 9:48 AM

## 2019-09-23 ENCOUNTER — Encounter: Payer: Self-pay | Admitting: Family Medicine

## 2019-09-23 ENCOUNTER — Ambulatory Visit (INDEPENDENT_AMBULATORY_CARE_PROVIDER_SITE_OTHER): Payer: Medicare Other | Admitting: Family Medicine

## 2019-09-23 ENCOUNTER — Other Ambulatory Visit: Payer: Self-pay

## 2019-09-23 VITALS — BP 153/84 | HR 64 | Temp 98.2°F | Ht 63.0 in | Wt 228.0 lb

## 2019-09-23 DIAGNOSIS — E1169 Type 2 diabetes mellitus with other specified complication: Secondary | ICD-10-CM

## 2019-09-23 DIAGNOSIS — E1159 Type 2 diabetes mellitus with other circulatory complications: Secondary | ICD-10-CM | POA: Diagnosis not present

## 2019-09-23 DIAGNOSIS — I10 Essential (primary) hypertension: Secondary | ICD-10-CM

## 2019-09-23 DIAGNOSIS — E785 Hyperlipidemia, unspecified: Secondary | ICD-10-CM | POA: Diagnosis not present

## 2019-09-23 DIAGNOSIS — E1142 Type 2 diabetes mellitus with diabetic polyneuropathy: Secondary | ICD-10-CM | POA: Insufficient documentation

## 2019-09-23 LAB — BAYER DCA HB A1C WAIVED: HB A1C (BAYER DCA - WAIVED): 10.6 % — ABNORMAL HIGH (ref <7.0)

## 2019-09-23 MED ORDER — JANUMET XR 50-1000 MG PO TB24: 1.0000 | ORAL_TABLET | Freq: Every day | ORAL | 1 refills | Status: DC

## 2019-09-23 MED ORDER — GABAPENTIN 300 MG PO CAPS: 300.0000 mg | ORAL_CAPSULE | Freq: Three times a day (TID) | ORAL | 1 refills | Status: DC

## 2019-09-23 MED ORDER — AMLODIPINE BESYLATE 5 MG PO TABS: 5.0000 mg | ORAL_TABLET | Freq: Every day | ORAL | 1 refills | Status: DC

## 2019-09-23 MED ORDER — LISINOPRIL-HYDROCHLOROTHIAZIDE 20-25 MG PO TABS: 1.0000 | ORAL_TABLET | Freq: Every day | ORAL | 1 refills | Status: DC

## 2019-09-23 MED ORDER — METFORMIN HCL 500 MG PO TABS: 500.0000 mg | ORAL_TABLET | Freq: Two times a day (BID) | ORAL | 1 refills | Status: DC

## 2019-09-23 MED ORDER — METOPROLOL TARTRATE 50 MG PO TABS: 50.0000 mg | ORAL_TABLET | Freq: Two times a day (BID) | ORAL | 1 refills | Status: DC

## 2019-09-23 MED ORDER — ATORVASTATIN CALCIUM 20 MG PO TABS: 20.0000 mg | ORAL_TABLET | Freq: Every evening | ORAL | 1 refills | Status: DC

## 2019-09-23 MED ORDER — OMEPRAZOLE 20 MG PO CPDR: 20.0000 mg | DELAYED_RELEASE_CAPSULE | Freq: Every day | ORAL | 1 refills | Status: DC

## 2019-09-23 NOTE — Patient Instructions (Addendum)
Your sugar was VERY uncontrolled today.  A1c 10.6.  I have changed your metformin to Janumet (take ONE daily).  See me back in 3 months for recheck of diabetes.  Have your diabetic eye exam results sent to me

## 2019-09-23 NOTE — Progress Notes (Signed)
Subjective: CC: DM PCP: Sheena Ip, DO OAC:[id number] Sheena Graham is a 68 y.o. female presenting to clinic today for:  1. Type 2 Diabetes w/ HTN, HLD and neuropathy:  Patient reports that she has not been taking her Metformin because the extended release was recalled.  She reports compliance with amlodipine and Zestoretic.  She also takes Lipitor.  Last eye exam: Has appointment scheduled in November with my eye doctor Last foot exam: Needs Last A1c:  Lab Results  Component Value Date   HGBA1C 10.6 (H) 09/23/2019   Nephropathy screen indicated?:  On ACE inhibitor Last flu, zoster and/or pneumovax: Influenza vaccine    There is no immunization history on file for this patient.  ROS: Denies dizziness, LOC, polyuria, polydipsia, unintended weight loss/gain, foot ulcerations, shortness of breath or chest pain.  She reports burning in her feet that is relieved with gabapentin.   ROS: Per HPI  No Known Allergies Past Medical History:  Diagnosis Date  . Arthritis   . Diabetes mellitus without complication (HCC)   . Hypertension     Current Outpatient Medications:  .  amLODipine (NORVASC) 5 MG tablet, Take 1 tablet (5 mg total) by mouth daily., Disp: 90 tablet, Rfl: 1 .  atorvastatin (LIPITOR) 20 MG tablet, Take 1 tablet (20 mg total) by mouth every evening., Disp: 90 tablet, Rfl: 1 .  colchicine 0.6 MG tablet, Take 0.6 mg by mouth daily. , Disp: , Rfl:  .  fluticasone (FLONASE) 50 MCG/ACT nasal spray, Place into the nose daily. , Disp: , Rfl:  .  gabapentin (NEURONTIN) 300 MG capsule, Take 1 capsule (300 mg total) by mouth 3 (three) times daily., Disp: 270 capsule, Rfl: 1 .  lisinopril-hydrochlorothiazide (ZESTORETIC) 20-25 MG tablet, Take 1 tablet by mouth daily., Disp: 90 tablet, Rfl: 1 .  metoprolol tartrate (LOPRESSOR) 50 MG tablet, Take 1 tablet (50 mg total) by mouth 2 (two) times a day., Disp: 180 tablet, Rfl: 1 .  omeprazole (PRILOSEC) 20 MG capsule, Take 1 capsule (20  mg total) by mouth daily., Disp: 90 capsule, Rfl: 1 .  acetaminophen (TYLENOL) 500 MG tablet, Take 500 mg by mouth every 6 (six) hours as needed for mild pain or moderate pain., Disp: , Rfl:  .  metFORMIN (GLUCOPHAGE-XR) 500 MG 24 hr tablet, Take 500 mg by mouth daily with breakfast. , Disp: , Rfl:  Social History   Socioeconomic History  . Marital status: Widowed    Spouse name: Not on file  . Number of children: Not on file  . Years of education: Not on file  . Highest education level: Not on file  Occupational History  . Not on file  Social Needs  . Financial resource strain: Not on file  . Food insecurity    Worry: Not on file    Inability: Not on file  . Transportation needs    Medical: Not on file    Non-medical: Not on file  Tobacco Use  . Smoking status: Former Smoker    Quit date: 06/24/1999    Years since quitting: 20.2  . Smokeless tobacco: Never Used  Substance and Sexual Activity  . Alcohol use: No  . Drug use: No  . Sexual activity: Not Currently  Lifestyle  . Physical activity    Days per week: Not on file    Minutes per session: Not on file  . Stress: Not on file  Relationships  . Social connections    Talks on phone: Not on  file    Gets together: Not on file    Attends religious service: Not on file    Active member of club or organization: Not on file    Attends meetings of clubs or organizations: Not on file    Relationship status: Not on file  . Intimate partner violence    Fear of current or ex partner: Not on file    Emotionally abused: Not on file    Physically abused: Not on file    Forced sexual activity: Not on file  Other Topics Concern  . Not on file  Social History Narrative  . Not on file   Family History  Problem Relation Age of Onset  . Hypertension Mother   . Cancer Mother        colon  . Hypertension Father   . Hypertension Sister   . Congestive Heart Failure Sister   . Aneurysm Sister   . Hypertension Brother   . Heart  attack Brother   . Pulmonary disease Brother     Objective: Office vital signs reviewed. BP (!) 153/84   Pulse 64   Temp 98.2 F (36.8 C) (Temporal)   Ht 5\' 3"  (1.6 Sheena)   Wt 228 lb (103.4 kg)   SpO2 99%   BMI 40.39 kg/Sheena   Physical Examination:  General: Awake, alert, well nourished, No acute distress HEENT: Normal, sclera white Cardio: regular rate and rhythm, S1S2 heard, SEM appreciated at b/l sternal borders Pulm: clear to auscultation bilaterally, no wheezes, rhonchi or rales; normal work of breathing on room air Extremities: warm, well perfused, No edema, cyanosis or clubbing; +1 pulses bilaterally MSK: normal gait and station Skin: dry; intact; no rashes or lesions Neuro: see DM foot exam Diabetic Foot Exam - Simple   Simple Foot Form Diabetic Foot exam was performed with the following findings: Yes 09/23/2019  5:35 PM  Visual Inspection No deformities, no ulcerations, no other skin breakdown bilaterally: Yes Sensation Testing Intact to touch and monofilament testing bilaterally: Yes Pulse Check Posterior Tibialis and Dorsalis pulse intact bilaterally: Yes Comments    Results for orders placed or performed in visit on 09/23/19 (from the past 24 hour(s))  Bayer DCA Hb A1c Waived     Status: Abnormal   Collection Time: 09/23/19  3:05 PM  Result Value Ref Range   HB A1C (BAYER DCA - WAIVED) 10.6 (H) <7.0 %   Narrative   Performed at:  [address] [place], [place]  [id number] Lab Director: [facility], Phone:  [phone]   Assessment/ Plan: 68 y.o. female   1. DM type 2 with diabetic dyslipidemia ([place]) Sugar not controlled with A1c of 10.6 today.  I have added Januvia to her metformin.  I will have her see me back in 3 months, sooner if needed.  She will get her diabetic eye exam done in November.  Influenza vaccine administered during today's visit. - Bayer DCA Hb A1c Waived - atorvastatin (LIPITOR) 20 MG tablet; Take 1 tablet  (20 mg total) by mouth every evening.  Dispense: 90 tablet; Refill: 1 - SitaGLIPtin-MetFORMIN HCl (JANUMET XR) 50-1000 MG TB24; Take 1 tablet by mouth daily with breakfast.  Dispense: 90 tablet; Refill: 1  2. Hypertension associated with diabetes ([place]) Not controlled.  Will not make any changes just yet.  If persistently elevated at her next visit plan to increase her amlodipine to 10 mg. - amLODipine (NORVASC) 5 MG tablet; Take 1 tablet (5 mg  total) by mouth daily.  Dispense: 90 tablet; Refill: 1 - lisinopril-hydrochlorothiazide (ZESTORETIC) 20-25 MG tablet; Take 1 tablet by mouth daily.  Dispense: 90 tablet; Refill: 1 - metoprolol tartrate (LOPRESSOR) 50 MG tablet; Take 1 tablet (50 mg total) by mouth 2 (two) times daily.  Dispense: 180 tablet; Refill: 1  3. Diabetic polyneuropathy associated with type 2 diabetes mellitus (HCC) Controlled with gabapentin.  This is been renewed - gabapentin (NEURONTIN) 300 MG capsule; Take 1 capsule (300 mg total) by mouth 3 (three) times daily.  Dispense: 270 capsule; Refill: 1   Orders Placed This Encounter  Procedures  . Bayer DCA Hb A1c Waived   No orders of the defined types were placed in this encounter.    Sheena IpAshly Sheena Matheson Vandehei, DO [facility] [phone]

## 2019-10-11 ENCOUNTER — Telehealth: Payer: Self-pay | Admitting: *Deleted

## 2019-10-11 NOTE — Telephone Encounter (Signed)
Janumet XR non preferred by insurance   Preferred alternatives are Kombiglyze XR or Jentadueto XR

## 2019-10-12 ENCOUNTER — Other Ambulatory Visit: Payer: Self-pay | Admitting: Family Medicine

## 2019-10-12 MED ORDER — JENTADUETO XR 5-1000 MG PO TB24: 1.0000 | ORAL_TABLET | Freq: Every day | ORAL | 2 refills | Status: DC

## 2019-10-12 NOTE — Telephone Encounter (Signed)
Pt aware.

## 2019-10-12 NOTE — Telephone Encounter (Signed)
Please inform patient that med changed to [facility].  Take 1 tablet every morning with breakfast.  Monitor BGs. Keep 3 m f/u for repeat A1c.

## 2020-01-03 ENCOUNTER — Telehealth: Payer: Self-pay | Admitting: Family Medicine

## 2020-01-04 ENCOUNTER — Encounter (INDEPENDENT_AMBULATORY_CARE_PROVIDER_SITE_OTHER): Payer: Self-pay | Admitting: *Deleted

## 2020-01-04 ENCOUNTER — Ambulatory Visit (INDEPENDENT_AMBULATORY_CARE_PROVIDER_SITE_OTHER): Payer: Medicare Other | Admitting: Family Medicine

## 2020-01-04 ENCOUNTER — Encounter: Payer: Self-pay | Admitting: Family Medicine

## 2020-01-04 ENCOUNTER — Other Ambulatory Visit: Payer: Self-pay

## 2020-01-04 VITALS — BP 146/79 | HR 65 | Temp 97.1°F | Ht 63.0 in | Wt 224.0 lb

## 2020-01-04 DIAGNOSIS — E1169 Type 2 diabetes mellitus with other specified complication: Secondary | ICD-10-CM | POA: Diagnosis not present

## 2020-01-04 DIAGNOSIS — I10 Essential (primary) hypertension: Secondary | ICD-10-CM

## 2020-01-04 DIAGNOSIS — E1142 Type 2 diabetes mellitus with diabetic polyneuropathy: Secondary | ICD-10-CM | POA: Diagnosis not present

## 2020-01-04 DIAGNOSIS — Z1211 Encounter for screening for malignant neoplasm of colon: Secondary | ICD-10-CM

## 2020-01-04 DIAGNOSIS — E785 Hyperlipidemia, unspecified: Secondary | ICD-10-CM

## 2020-01-04 DIAGNOSIS — E1159 Type 2 diabetes mellitus with other circulatory complications: Secondary | ICD-10-CM

## 2020-01-04 LAB — BAYER DCA HB A1C WAIVED: HB A1C (BAYER DCA - WAIVED): 8.1 % — ABNORMAL HIGH (ref <7.0)

## 2020-01-04 MED ORDER — GLIPIZIDE ER 5 MG PO TB24: 5.0000 mg | ORAL_TABLET | Freq: Every day | ORAL | 0 refills | Status: DC

## 2020-01-04 MED ORDER — JENTADUETO XR 5-1000 MG PO TB24: 1.0000 | ORAL_TABLET | Freq: Every day | ORAL | 3 refills | Status: DC

## 2020-01-04 NOTE — Progress Notes (Signed)
Subjective: CC: DM PCP: Janora Norlander, DO LEX:[id number] E Bracher is a 69 y.o. female presenting to clinic today for:  1. Type 2 Diabetes w/ HTN, HLD and neuropathy:  Patient was started on Jentadueto was added last visit due to uncontrolled BGs.  She reports compliance with amlodipine and Zestoretic.  She also takes Lipitor.  She reports tolerance of the Jentadueto.  Denies any GI side effects.  BGs 160-190 w. Occasional 200.  Last eye exam: Done at [facility] Last foot exam: UTD Last A1c:  Lab Results  Component Value Date   HGBA1C 10.6 (H) 09/23/2019   Nephropathy screen indicated?:  On ACE inhibitor Last flu, zoster and/or pneumovax: Influenza vaccine   Immunization History  Administered Date(s) Administered  . Influenza, High Dose Seasonal PF 11/27/2017, 09/30/2018  . Influenza, Seasonal, Injecte, Preservative Fre 09/24/2015  . Influenza,inj,Quad PF,6+ Mos 09/11/2016  . Pneumococcal Conjugate-13 06/29/2018  . Pneumococcal Polysaccharide-23 02/15/2016, 04/14/2017  . Tdap 05/01/2014    ROS: Denies dizziness, LOC, polyuria, polydipsia, unintended weight loss/gain, foot ulcerations, shortness of breath or chest pain.  She reports neuralgia in her feet that is controlled with gabapentin.    ROS: Per HPI  No Known Allergies Past Medical History:  Diagnosis Date  . Arthritis   . Diabetes mellitus without complication ([place])   . Hypertension     Current Outpatient Medications:  .  acetaminophen (TYLENOL) 500 MG tablet, Take 500 mg by mouth every 6 (six) hours as needed for mild pain or moderate pain., Disp: , Rfl:  .  amLODipine (NORVASC) 5 MG tablet, Take 1 tablet (5 mg total) by mouth daily., Disp: 90 tablet, Rfl: 1 .  atorvastatin (LIPITOR) 20 MG tablet, Take 1 tablet (20 mg total) by mouth every evening., Disp: 90 tablet, Rfl: 1 .  colchicine 0.6 MG tablet, Take 0.6 mg by mouth daily. , Disp: , Rfl:  .  fluticasone (FLONASE) 50 MCG/ACT nasal spray, Place into the  nose daily. , Disp: , Rfl:  .  gabapentin (NEURONTIN) 300 MG capsule, Take 1 capsule (300 mg total) by mouth 3 (three) times daily., Disp: 270 capsule, Rfl: 1 .  linaGLIPtin-metFORMIN HCl ER (JENTADUETO XR) 03-999 MG TB24, Take 1 tablet by mouth daily with breakfast., Disp: 30 tablet, Rfl: 2 .  lisinopril-hydrochlorothiazide (ZESTORETIC) 20-25 MG tablet, Take 1 tablet by mouth daily., Disp: 90 tablet, Rfl: 1 .  metoprolol tartrate (LOPRESSOR) 50 MG tablet, Take 1 tablet (50 mg total) by mouth 2 (two) times daily., Disp: 180 tablet, Rfl: 1 .  omeprazole (PRILOSEC) 20 MG capsule, Take 1 capsule (20 mg total) by mouth daily., Disp: 90 capsule, Rfl: 1 Social History   Socioeconomic History  . Marital status: Widowed    Spouse name: Not on file  . Number of children: Not on file  . Years of education: Not on file  . Highest education level: Not on file  Occupational History  . Not on file  Tobacco Use  . Smoking status: Former Smoker    Quit date: 06/24/1999    Years since quitting: 20.5  . Smokeless tobacco: Never Used  Substance and Sexual Activity  . Alcohol use: No  . Drug use: No  . Sexual activity: Not Currently  Other Topics Concern  . Not on file  Social History Narrative  . Not on file   Social Determinants of Health   Financial Resource Strain:   . Difficulty of Paying Living Expenses: Not on file  Food Insecurity:   .  Worried About Charity fundraiser in the Last Year: Not on file  . Ran Out of Food in the Last Year: Not on file  Transportation Needs:   . Lack of Transportation (Medical): Not on file  . Lack of Transportation (Non-Medical): Not on file  Physical Activity:   . Days of Exercise per Week: Not on file  . Minutes of Exercise per Session: Not on file  Stress:   . Feeling of Stress : Not on file  Social Connections:   . Frequency of Communication with Friends and Family: Not on file  . Frequency of Social Gatherings with Friends and Family: Not on file    . Attends Religious Services: Not on file  . Active Member of Clubs or Organizations: Not on file  . Attends Archivist Meetings: Not on file  . Marital Status: Not on file  Intimate Partner Violence:   . Fear of Current or Ex-Partner: Not on file  . Emotionally Abused: Not on file  . Physically Abused: Not on file  . Sexually Abused: Not on file   Family History  Problem Relation Age of Onset  . Hypertension Mother   . Cancer Mother        colon  . Hypertension Father   . Hypertension Sister   . Congestive Heart Failure Sister   . Aneurysm Sister   . Hypertension Brother   . Heart attack Brother   . Pulmonary disease Brother     Objective: Office vital signs reviewed. BP (!) 146/79   Pulse 65   Temp (!) 97.1 F (36.2 C) (Temporal)   Ht _0  (1.6 m)   Wt 224 lb (101.6 kg)   SpO2 99%   BMI 39.68 kg/m   Physical Examination:  General: Awake, alert, well nourished, No acute distress HEENT: Normal, sclera white Cardio: regular rate and rhythm, S1S2 heard, SEM appreciated at b/l sternal borders Pulm: clear to auscultation bilaterally, no wheezes, rhonchi or rales; normal work of breathing on room air Extremities: warm, well perfused, No edema, cyanosis or clubbing; +1 pulses bilaterally MSK: normal gait and station Skin: dry; intact; no rashes or lesions   Assessment/ Plan: 69 y.o. female   1. DM type 2 with diabetic dyslipidemia ([place]) Improving but not yet controlled.  I have added a small amount of glipizide to her regimen.  We will follow-up in 3 months.  She is to continue monitoring blood sugars we discussed goal of less than 150 daily.  She was good understanding. - Bayer DCA Hb A1c Waived - CMP14+EGFR - Lipid Panel - TSH - glipiZIDE (GLUCOTROL XL) 5 MG 24 hr tablet; Take 1 tablet (5 mg total) by mouth daily with breakfast.  Dispense: 90 tablet; Refill: 0  2. Hypertension associated with diabetes ([place]) Borderline but technically controlled for  age - CMP14+EGFR  3. Diabetic polyneuropathy associated with type 2 diabetes mellitus ([place]) Continue gabapentin.  No refills needed  4. Screen for colon cancer Referred back to Dr Laural Golden.  Last colonoscopy 2009. - Ambulatory referral to Gastroenterology   Orders Placed This Encounter  Procedures  . Bayer DCA Hb A1c Waived  . CMP14+EGFR  . Lipid Panel  . TSH   Meds ordered this encounter  Medications  . glipiZIDE (GLUCOTROL XL) 5 MG 24 hr tablet    Sig: Take 1 tablet (5 mg total) by mouth daily with breakfast.    Dispense:  90 tablet    Refill:  0  . linaGLIPtin-metFORMIN HCl  ER (JENTADUETO XR) 03-999 MG TB24    Sig: Take 1 tablet by mouth daily with breakfast.    Dispense:  90 tablet    Refill:  3     Nuno Brubacher Windell Moulding, DO [place] [phone]

## 2020-01-04 NOTE — Patient Instructions (Signed)
A1c is getting BETTER!  It was 10.6 last time and was down to 8.1 today. Your goal is 7 or less.  Continue the Jentadueto.  I have added Glipizide to take with breakfast.  We will recheck in 3 months.  Keep trying to work on cutting back carbs/ sugar/ bread/ pasta.

## 2020-01-05 LAB — CMP14+EGFR
ALT: 39 IU/L — ABNORMAL HIGH (ref 0–32)
AST: 35 IU/L (ref 0–40)
Albumin/Globulin Ratio: 1.5 (ref 1.2–2.2)
Albumin: 4 g/dL (ref 3.8–4.8)
Alkaline Phosphatase: 50 IU/L (ref 39–117)
BUN/Creatinine Ratio: 16 (ref 12–28)
BUN: 16 mg/dL (ref 8–27)
Bilirubin Total: 0.3 mg/dL (ref 0.0–1.2)
CO2: 23 mmol/L (ref 20–29)
Calcium: 9.6 mg/dL (ref 8.7–10.3)
Chloride: 101 mmol/L (ref 96–106)
Creatinine, Ser: 1 mg/dL (ref 0.57–1.00)
GFR calc Af Amer: 67 mL/min/{1.73_m2} (ref >59)
GFR calc non Af Amer: 58 mL/min/{1.73_m2} — ABNORMAL LOW (ref >59)
Globulin, Total: 2.7 g/dL (ref 1.5–4.5)
Glucose: 138 mg/dL — ABNORMAL HIGH (ref 65–99)
Potassium: 4.4 mmol/L (ref 3.5–5.2)
Sodium: 140 mmol/L (ref 134–144)
Total Protein: 6.7 g/dL (ref 6.0–8.5)

## 2020-01-05 LAB — LIPID PANEL
Chol/HDL Ratio: 2.2 ratio (ref 0.0–4.4)
Cholesterol, Total: 125 mg/dL (ref 100–199)
HDL: 57 mg/dL (ref >39)
LDL Chol Calc (NIH): 48 mg/dL (ref 0–99)
Triglycerides: 110 mg/dL (ref 0–149)
VLDL Cholesterol Cal: 20 mg/dL (ref 5–40)

## 2020-01-05 LAB — TSH: TSH: 3.7 u[IU]/mL (ref 0.450–4.500)

## 2020-01-10 ENCOUNTER — Encounter: Payer: Self-pay | Admitting: *Deleted

## 2020-01-16 ENCOUNTER — Other Ambulatory Visit: Payer: Self-pay | Admitting: *Deleted

## 2020-01-16 MED ORDER — GLUCOSE BLOOD VI STRP: ORAL_STRIP | 3 refills | Status: DC

## 2020-01-19 ENCOUNTER — Other Ambulatory Visit: Payer: Self-pay | Admitting: *Deleted

## 2020-01-19 MED ORDER — COLCHICINE 0.6 MG PO TABS: 0.6000 mg | ORAL_TABLET | Freq: Every day | ORAL | 2 refills | Status: DC

## 2020-02-23 ENCOUNTER — Ambulatory Visit: Payer: Medicare Other | Attending: Internal Medicine

## 2020-02-23 DIAGNOSIS — Z23 Encounter for immunization: Secondary | ICD-10-CM

## 2020-02-23 NOTE — Progress Notes (Signed)
   Covid-19 Vaccination Clinic  Name:  Sheena Graham    MRN: [id number] DOB: Apr 05, 1951  02/23/2020  [name] was observed post Covid-19 immunization for 15 minutes without incident. She was provided with Vaccine Information Sheet and instruction to access the V-Safe system.   [name] was instructed to call 911 with any severe reactions post vaccine: Marland Kitchen Difficulty breathing  . Swelling of face and throat  . A fast heartbeat  . A bad rash all over body  . Dizziness and weakness   Immunizations Administered    Name Date Dose VIS Date Route   Moderna COVID-19 Vaccine 02/23/2020  2:11 PM 0.5 mL 10/25/2019 Intramuscular   Manufacturer: Moderna   Lot: [id number]   NDC: 23536-144-31

## 2020-03-22 ENCOUNTER — Ambulatory Visit: Payer: Medicare Other | Attending: Internal Medicine

## 2020-03-22 DIAGNOSIS — Z23 Encounter for immunization: Secondary | ICD-10-CM

## 2020-03-22 NOTE — Progress Notes (Signed)
   Covid-19 Vaccination Clinic  Name:  Sheena Graham    MRN: [id number] DOB: 18-Mar-1951  03/22/2020  [name] was observed post Covid-19 immunization for 15 minutes without incident. She was provided with Vaccine Information Sheet and instruction to access the V-Safe system.   [name] was instructed to call 911 with any severe reactions post vaccine: Marland Kitchen Difficulty breathing  . Swelling of face and throat  . A fast heartbeat  . A bad rash all over body  . Dizziness and weakness   Immunizations Administered    Name Date Dose VIS Date Route   Moderna COVID-19 Vaccine 03/22/2020  1:27 PM 0.5 mL 10/2019 Intramuscular   Manufacturer: Moderna   Lot: [id number]   NDC: 49179-150-56

## 2020-03-27 ENCOUNTER — Ambulatory Visit (INDEPENDENT_AMBULATORY_CARE_PROVIDER_SITE_OTHER): Payer: Medicare Other | Admitting: Family Medicine

## 2020-03-27 ENCOUNTER — Encounter: Payer: Self-pay | Admitting: Family Medicine

## 2020-03-27 ENCOUNTER — Other Ambulatory Visit: Payer: Self-pay

## 2020-03-27 VITALS — BP 132/70 | HR 67 | Temp 98.1°F | Ht 63.0 in | Wt 225.0 lb

## 2020-03-27 DIAGNOSIS — E785 Hyperlipidemia, unspecified: Secondary | ICD-10-CM

## 2020-03-27 DIAGNOSIS — E1169 Type 2 diabetes mellitus with other specified complication: Secondary | ICD-10-CM | POA: Diagnosis not present

## 2020-03-27 DIAGNOSIS — E1159 Type 2 diabetes mellitus with other circulatory complications: Secondary | ICD-10-CM | POA: Diagnosis not present

## 2020-03-27 DIAGNOSIS — I10 Essential (primary) hypertension: Secondary | ICD-10-CM

## 2020-03-27 DIAGNOSIS — Z1211 Encounter for screening for malignant neoplasm of colon: Secondary | ICD-10-CM

## 2020-03-27 DIAGNOSIS — E1142 Type 2 diabetes mellitus with diabetic polyneuropathy: Secondary | ICD-10-CM | POA: Diagnosis not present

## 2020-03-27 LAB — BAYER DCA HB A1C WAIVED: HB A1C (BAYER DCA - WAIVED): 6.4 % (ref <7.0)

## 2020-03-27 MED ORDER — LISINOPRIL-HYDROCHLOROTHIAZIDE 20-25 MG PO TABS: 1.0000 | ORAL_TABLET | Freq: Every day | ORAL | 1 refills | Status: DC

## 2020-03-27 MED ORDER — ATORVASTATIN CALCIUM 20 MG PO TABS: 20.0000 mg | ORAL_TABLET | Freq: Every evening | ORAL | 1 refills | Status: DC

## 2020-03-27 MED ORDER — GLIPIZIDE ER 5 MG PO TB24: 5.0000 mg | ORAL_TABLET | Freq: Every day | ORAL | 0 refills | Status: DC

## 2020-03-27 MED ORDER — OMEPRAZOLE 20 MG PO CPDR: 20.0000 mg | DELAYED_RELEASE_CAPSULE | Freq: Every day | ORAL | 1 refills | Status: DC

## 2020-03-27 MED ORDER — GLUCOSE BLOOD VI STRP: ORAL_STRIP | 3 refills | Status: AC

## 2020-03-27 MED ORDER — AMLODIPINE BESYLATE 5 MG PO TABS: 5.0000 mg | ORAL_TABLET | Freq: Every day | ORAL | 1 refills | Status: DC

## 2020-03-27 MED ORDER — GABAPENTIN 300 MG PO CAPS: 300.0000 mg | ORAL_CAPSULE | Freq: Three times a day (TID) | ORAL | 1 refills | Status: DC

## 2020-03-27 MED ORDER — METOPROLOL TARTRATE 50 MG PO TABS: 50.0000 mg | ORAL_TABLET | Freq: Two times a day (BID) | ORAL | 1 refills | Status: DC

## 2020-03-27 NOTE — Progress Notes (Signed)
Subjective: CC: DM PCP: Raliegh Ip, DO Sheena Graham is a 69 y.o. female presenting to clinic today for:  1. Type 2 Diabetes w/ HTN, HLD and neuropathy:  Patient was started on Glipizide was added last visit due to uncontrolled BGs.  She reports compliance with Jentadueto, amlodipine, Zestoretic, Lipitor.  Has not been checking BGs because she has been getting an error about insufficient blood.  Last eye exam: needs to schedule Last foot exam: UTD Last A1c:  Lab Results  Component Value Date   HGBA1C 8.1 (H) 01/04/2020   Nephropathy screen indicated?:  On ACE inhibitor Last flu, zoster and/or pneumovax: UTD Immunization History  Administered Date(s) Administered  . Influenza, High Dose Seasonal PF 11/27/2017, 09/30/2018  . Influenza, Seasonal, Injecte, Preservative Fre 09/24/2015  . Influenza,inj,Quad PF,6+ Mos 09/11/2016  . Moderna SARS-COVID-2 Vaccination 02/23/2020, 03/22/2020  . Pneumococcal Conjugate-13 06/29/2018  . Pneumococcal Polysaccharide-23 02/15/2016, 04/14/2017  . Tdap 05/01/2014    ROS: Denies dizziness, LOC, polyuria, polydipsia, unintended weight loss/gain, foot ulcerations, shortness of breath or chest pain.  She has chronic neuralgia in her feet that is controlled with gabapentin. No visual disturbance   ROS: Per HPI  No Known Allergies Past Medical History:  Diagnosis Date  . Arthritis   . Diabetes mellitus without complication (HCC)   . Hypertension     Current Outpatient Medications:  .  acetaminophen (TYLENOL) 500 MG tablet, Take 500 mg by mouth every 6 (six) hours as needed for mild pain or moderate pain., Disp: , Rfl:  .  amLODipine (NORVASC) 5 MG tablet, Take 1 tablet (5 mg total) by mouth daily., Disp: 90 tablet, Rfl: 1 .  atorvastatin (LIPITOR) 20 MG tablet, Take 1 tablet (20 mg total) by mouth every evening., Disp: 90 tablet, Rfl: 1 .  colchicine 0.6 MG tablet, Take 1 tablet (0.6 mg total) by mouth daily., Disp: 30 tablet,  Rfl: 2 .  fluticasone (FLONASE) 50 MCG/ACT nasal spray, Place into the nose daily. , Disp: , Rfl:  .  gabapentin (NEURONTIN) 300 MG capsule, Take 1 capsule (300 mg total) by mouth 3 (three) times daily., Disp: 270 capsule, Rfl: 1 .  glipiZIDE (GLUCOTROL XL) 5 MG 24 hr tablet, Take 1 tablet (5 mg total) by mouth daily with breakfast., Disp: 90 tablet, Rfl: 0 .  glucose blood test strip, Test BS daily Dx E11.42, Disp: 100 each, Rfl: 3 .  linaGLIPtin-metFORMIN HCl ER (JENTADUETO XR) 03-999 MG TB24, Take 1 tablet by mouth daily with breakfast., Disp: 90 tablet, Rfl: 3 .  lisinopril-hydrochlorothiazide (ZESTORETIC) 20-25 MG tablet, Take 1 tablet by mouth daily., Disp: 90 tablet, Rfl: 1 .  metoprolol tartrate (LOPRESSOR) 50 MG tablet, Take 1 tablet (50 mg total) by mouth 2 (two) times daily., Disp: 180 tablet, Rfl: 1 .  omeprazole (PRILOSEC) 20 MG capsule, Take 1 capsule (20 mg total) by mouth daily., Disp: 90 capsule, Rfl: 1 Social History   Socioeconomic History  . Marital status: Widowed    Spouse name: Not on file  . Number of children: Not on file  . Years of education: Not on file  . Highest education level: Not on file  Occupational History  . Not on file  Tobacco Use  . Smoking status: Former Smoker    Quit date: 06/24/1999    Years since quitting: 20.7  . Smokeless tobacco: Never Used  Substance and Sexual Activity  . Alcohol use: No  . Drug use: No  . Sexual activity: Not Currently  Other Topics Concern  . Not on file  Social History Narrative  . Not on file   Social Determinants of Health   Financial Resource Strain:   . Difficulty of Paying Living Expenses:   Food Insecurity:   . Worried About Charity fundraiser in the Last Year:   . Arboriculturist in the Last Year:   Transportation Needs:   . Film/video editor (Medical):   Marland Kitchen Lack of Transportation (Non-Medical):   Physical Activity:   . Days of Exercise per Week:   . Minutes of Exercise per Session:     Stress:   . Feeling of Stress :   Social Connections:   . Frequency of Communication with Friends and Family:   . Frequency of Social Gatherings with Friends and Family:   . Attends Religious Services:   . Active Member of Clubs or Organizations:   . Attends Archivist Meetings:   Marland Kitchen Marital Status:   Intimate Partner Violence:   . Fear of Current or Ex-Partner:   . Emotionally Abused:   Marland Kitchen Physically Abused:   . Sexually Abused:    Family History  Problem Relation Age of Onset  . Hypertension Mother   . Cancer Mother        colon  . Hypertension Father   . Hypertension Sister   . Congestive Heart Failure Sister   . Aneurysm Sister   . Hypertension Brother   . Heart attack Brother   . Pulmonary disease Brother     Objective: Office vital signs reviewed. BP 132/70   Pulse 67   Temp 98.1 F (36.7 C) (Oral)   Ht 5\' 3"  (1.6 m)   Wt 225 lb (102.1 kg)   SpO2 97%   BMI 39.86 kg/m   Physical Examination:  General: Awake, alert, well nourished, No acute distress HEENT: Normal, sclera white Cardio: regular rate and rhythm, S1S2 heard, 2/6 SEM appreciated at b/l sternal borders Pulm: clear to auscultation bilaterally, no wheezes, rhonchi or rales; normal work of breathing on room air Extremities: warm, well perfused, No edema, cyanosis or clubbing; +1 pulses bilaterally MSK: normal gait and station Skin: dry; intact; no rashes or lesions  Assessment/ Plan: 69 y.o. female   1. DM type 2 with diabetic dyslipidemia ([place]) Controlled.  A1c down to 6.4 today.  Continue current regimen.  New One Touch Verio meter given and demonstrated to patient today.  F/u in 4 months.  Get DM eye exam done - Hepatic Function Panel - Bayer DCA Hb A1c Waived - atorvastatin (LIPITOR) 20 MG tablet; Take 1 tablet (20 mg total) by mouth every evening.  Dispense: 90 tablet; Refill: 1 - glipiZIDE (GLUCOTROL XL) 5 MG 24 hr tablet; Take 1 tablet (5 mg total) by mouth daily with breakfast.   Dispense: 90 tablet; Refill: 0 - glucose blood test strip; Test BS daily Dx E11.42 One touch verio  Dispense: 100 each; Refill: 3  2. Hypertension associated with diabetes ([place]) Controlled - Hepatic Function Panel - amLODipine (NORVASC) 5 MG tablet; Take 1 tablet (5 mg total) by mouth daily.  Dispense: 90 tablet; Refill: 1 - lisinopril-hydrochlorothiazide (ZESTORETIC) 20-25 MG tablet; Take 1 tablet by mouth daily.  Dispense: 90 tablet; Refill: 1 - metoprolol tartrate (LOPRESSOR) 50 MG tablet; Take 1 tablet (50 mg total) by mouth 2 (two) times daily.  Dispense: 180 tablet; Refill: 1  3. Diabetic polyneuropathy associated with type 2 diabetes mellitus (HCC) Stable - gabapentin (NEURONTIN) 300 MG  capsule; Take 1 capsule (300 mg total) by mouth 3 (three) times daily.  Dispense: 270 capsule; Refill: 1  4. Screen for colon cancer Reviewed last colonoscopy result 2009.  Patient average risk so cologuard sent for colon cancer screening. - Cologuard   No orders of the defined types were placed in this encounter.  No orders of the defined types were placed in this encounter.    Raliegh Ip, DO [facility] [phone]

## 2020-03-27 NOTE — Patient Instructions (Signed)
Your sugar is CONTROLLED!  Yaaaaay!  Great job!  You were shown how to use your new meter today.  Test once weekly and anytime you feel funny.  See me back in 4 months  Get your eye exam scheduled  Cologuard kit for colon cancer screening will be sent to your house.  

## 2020-03-28 LAB — HEPATIC FUNCTION PANEL
ALT: 28 IU/L (ref 0–32)
AST: 35 IU/L (ref 0–40)
Albumin: 4.3 g/dL (ref 3.8–4.8)
Alkaline Phosphatase: 48 IU/L (ref 39–117)
Bilirubin Total: 0.4 mg/dL (ref 0.0–1.2)
Bilirubin, Direct: 0.11 mg/dL (ref 0.00–0.40)
Total Protein: 7 g/dL (ref 6.0–8.5)

## 2020-03-29 ENCOUNTER — Ambulatory Visit: Payer: Medicare Other

## 2020-04-02 ENCOUNTER — Ambulatory Visit: Payer: Medicare Other | Admitting: Family Medicine

## 2020-04-04 ENCOUNTER — Ambulatory Visit (INDEPENDENT_AMBULATORY_CARE_PROVIDER_SITE_OTHER): Payer: Medicare Other | Admitting: *Deleted

## 2020-04-04 DIAGNOSIS — Z0000 Encounter for general adult medical examination without abnormal findings: Secondary | ICD-10-CM | POA: Diagnosis not present

## 2020-04-04 NOTE — Progress Notes (Signed)
...     [place] VISIT  04/04/2020  Telephone Visit Disclaimer This Medicare AWV was conducted by telephone due to national recommendations for restrictions regarding the COVID-19 Pandemic (e.g. social distancing).  I verified, using two identifiers, that I am speaking with Lacey E Cervi or their authorized healthcare agent. I discussed the limitations, risks, security, and privacy concerns of performing an evaluation and management service by telephone and the potential availability of an in-person appointment in the future. The patient expressed understanding and agreed to proceed.   Subjective:  Aleya E Marsan is a 69 y.o. female patient of Lajuana Ripple, Ashly M, DO who had a Medicare Annual Wellness Visit today via telephone. Staceyann is Disabled and lives with their family. she has 2 children. she reports that she is not socially active and does interact with friends/family regularly. she is not physically active and enjoys gardening.  Patient Care Team: Janora Norlander, DO as PCP - General (Family Medicine)  Advanced Directives 04/04/2020 03/09/2016 12/31/2015  Does Patient Have a Medical Advance Directive? No No No  Would patient like information on creating a medical advance directive? No - Patient declined - No - patient declined information    Hospital Utilization Over the Past 12 Months: # of hospitalizations or ER visits: 0 # of surgeries: 0  Review of Systems    Patient reports that her overall health is worse compared to last year.  History obtained from chart review and the patient  Patient Reported Readings (BP, Pulse, CBG, Weight, etc) none  Pain Assessment Pain : No/denies pain     Current Medications & Allergies (verified) Allergies as of 04/04/2020   No Known Allergies     Medication List       Accurate as of Apr 04, 2020  8:57 AM. If you have any questions, ask your nurse or doctor.        acetaminophen 500 MG tablet Commonly known  as: TYLENOL Take 500 mg by mouth every 6 (six) hours as needed for mild pain or moderate pain.   amLODipine 5 MG tablet Commonly known as: NORVASC Take 1 tablet (5 mg total) by mouth daily.   atorvastatin 20 MG tablet Commonly known as: LIPITOR Take 1 tablet (20 mg total) by mouth every evening.   colchicine 0.6 MG tablet Take 1 tablet (0.6 mg total) by mouth daily. What changed:   when to take this  reasons to take this   fluticasone 50 MCG/ACT nasal spray Commonly known as: FLONASE Place into the nose daily.   gabapentin 300 MG capsule Commonly known as: NEURONTIN Take 1 capsule (300 mg total) by mouth 3 (three) times daily.   glipiZIDE 5 MG 24 hr tablet Commonly known as: GLUCOTROL XL Take 1 tablet (5 mg total) by mouth daily with breakfast.   glucose blood test strip Test BS daily Dx E11.42 One touch verio   Jentadueto XR 03-999 MG Tb24 Generic drug: linaGLIPtin-metFORMIN HCl ER Take 1 tablet by mouth daily with breakfast.   lisinopril-hydrochlorothiazide 20-25 MG tablet Commonly known as: ZESTORETIC Take 1 tablet by mouth daily.   metoprolol tartrate 50 MG tablet Commonly known as: LOPRESSOR Take 1 tablet (50 mg total) by mouth 2 (two) times daily.   omeprazole 20 MG capsule Commonly known as: PRILOSEC Take 1 capsule (20 mg total) by mouth daily.       History (reviewed): Past Medical History:  Diagnosis Date  . Arthritis   . Diabetes mellitus without complication ([place])   .  Hypertension    History reviewed. No pertinent surgical history. Family History  Problem Relation Age of Onset  . Hypertension Mother   . Cancer Mother        colon  . Hypertension Father   . Hypertension Sister   . Congestive Heart Failure Sister   . Aneurysm Sister   . Hypertension Brother   . Heart attack Brother   . Pulmonary disease Brother    Social History   Socioeconomic History  . Marital status: Widowed    Spouse name: Not on file  . Number of children:  2  . Years of education: 9  . Highest education level: 9th grade  Occupational History  . Not on file  Tobacco Use  . Smoking status: Former Smoker    Years: 15.00    Quit date: 06/24/1999    Years since quitting: 20.7  . Smokeless tobacco: Never Used  Substance and Sexual Activity  . Alcohol use: No  . Drug use: No  . Sexual activity: Not Currently  Other Topics Concern  . Not on file  Social History Narrative   Disabled   Her son and grandson live with her   Social Determinants of Health   Financial Resource Strain:   . Difficulty of Paying Living Expenses:   Food Insecurity: No Food Insecurity  . Worried About Programme researcher, broadcasting/film/video in the Last Year: Never true  . Ran Out of Food in the Last Year: Never true  Transportation Needs: No Transportation Needs  . Lack of Transportation (Medical): No  . Lack of Transportation (Non-Medical): No  Physical Activity: Inactive  . Days of Exercise per Week: 0 days  . Minutes of Exercise per Session: 0 min  Stress: No Stress Concern Present  . Feeling of Stress : Not at all  Social Connections: Somewhat Isolated  . Frequency of Communication with Friends and Family: More than three times a week  . Frequency of Social Gatherings with Friends and Family: More than three times a week  . Attends Religious Services: 1 to 4 times per year  . Active Member of Clubs or Organizations: No  . Attends Banker Meetings: Never  . Marital Status: Widowed    Activities of Daily Living In your present state of health, do you have any difficulty performing the following activities: 04/04/2020  Hearing? N  Vision? N  Difficulty concentrating or making decisions? N  Walking or climbing stairs? Y  Comment back and legs weak  Dressing or bathing? N  Doing errands, shopping? N  Preparing Food and eating ? N  Using the Toilet? N  In the past six months, have you accidently leaked urine? N  Do you have problems with loss of bowel  control? N  Managing your Medications? N  Managing your Finances? N  Housekeeping or managing your Housekeeping? N  Some recent data might be hidden    Patient Education/ Literacy How often do you need to have someone help you when you read instructions, pamphlets, or other written materials from your doctor or pharmacy?: 1 - Never  Exercise Current Exercise Habits: The patient does not participate in regular exercise at present, Exercise limited by: orthopedic condition(s)  Diet Patient reports consuming 3 meals a day and 1 snack(s) a day Patient reports that her primary diet is: Regular Patient reports that she does have regular access to food.   Depression Screen PHQ 2/9 Scores 04/04/2020 03/27/2020 01/04/2020 09/23/2019 06/24/2019  PHQ - 2 Score  0 0 0 0 1  PHQ- 9 Score - 0 0 0 -     Fall Risk Fall Risk  04/04/2020 03/27/2020 01/04/2020 09/23/2019 06/24/2019  Falls in the past year? 0 0 0 0 0     Objective:  Mckenley E Sudbury seemed alert and oriented and she participated appropriately during our telephone visit.  Blood Pressure Weight BMI  BP Readings from Last 3 Encounters:  03/27/20 132/70  01/04/20 (!) 146/79  09/23/19 (!) 153/84   Wt Readings from Last 3 Encounters:  03/27/20 225 lb (102.1 kg)  01/04/20 224 lb (101.6 kg)  09/23/19 228 lb (103.4 kg)   BMI Readings from Last 1 Encounters:  03/27/20 39.86 kg/m    *Unable to obtain current vital signs, weight, and BMI due to telephone visit type  Hearing/Vision  . Oriya did not seem to have difficulty with hearing/understanding during the telephone conversation . Reports that she has had a formal eye exam by an eye care professional within the past year . Reports that she has not had a formal hearing evaluation within the past year *Unable to fully assess hearing and vision during telephone visit type  Cognitive Function: 6CIT Screen 04/04/2020  What Year? 0 points  What month? 0 points  What time? 0 points  Count  back from 20 0 points  Months in reverse 4 points  Repeat phrase 6 points  Total Score 10   (Normal:0-7, Significant for Dysfunction: >8)  Normal Cognitive Function Screening: No:    Immunization & Health Maintenance Record Immunization History  Administered Date(s) Administered  . Influenza, High Dose Seasonal PF 11/27/2017, 09/30/2018  . Influenza, Seasonal, Injecte, Preservative Fre 09/24/2015  . Influenza,inj,Quad PF,6+ Mos 09/11/2016  . Moderna SARS-COVID-2 Vaccination 02/23/2020, 03/22/2020  . Pneumococcal Conjugate-13 06/29/2018  . Pneumococcal Polysaccharide-23 02/15/2016, 04/14/2017  . Tdap 05/01/2014    Health Maintenance  Topic Date Due  . OPHTHALMOLOGY EXAM  Never done  . Fecal DNA (Cologuard)  Never done  . MAMMOGRAM  05/10/2020  . INFLUENZA VACCINE  06/24/2020  . FOOT EXAM  09/22/2020  . HEMOGLOBIN A1C  09/27/2020  . TETANUS/TDAP  05/01/2024  . DEXA SCAN  Completed  . COVID-19 Vaccine  Completed  . Hepatitis C Screening  Completed  . PNA vac Low Risk Adult  Completed       Assessment  This is a routine wellness examination for Temia E Lienau.  Health Maintenance: Due or Overdue Health Maintenance Due  Topic Date Due  . OPHTHALMOLOGY EXAM  Never done  . Fecal DNA (Cologuard)  Never done    Triva E Benincasa does not need a referral for Community Assistance: Care Management:   no Social Work:    no Prescription Assistance:  no Nutrition/Diabetes Education:  no   Plan:  Personalized Goals Goals Addressed            This Visit's Progress   . DIET - INCREASE WATER INTAKE      . Increase physical activity       Do more outside activities per patient      Personalized Health Maintenance & Screening Recommendations  Colorectal cancer screening Shingles vaccine  Lung Cancer Screening Recommended: no (Low Dose CT Chest recommended if Age 84-80 years, 30 pack-year currently smoking OR have quit w/in past 15 years) Hepatitis C Screening  recommended: no HIV Screening recommended: no  Advanced Directives: Written information was not prepared per patient's request.  Referrals & Orders No orders of the defined types  were placed in this encounter.   Follow-up Plan . Follow-up with Raliegh Ip, DO as planned on 07/31/20 . Needs shingles vaccine . Cologaurd ordered at 03/27/20 visit, needs follow up. . Pt had diabetic eye exam in 09/2019 at [facility]. Records requested. . Covid vaccine is complete. . Pt voices no healthcare concerns. . AVS printed and mailed to patient.    I have personally reviewed and noted the following in the patient's chart:   . Medical and social history . Use of alcohol, tobacco or illicit drugs  . Current medications and supplements . Functional ability and status . Nutritional status . Physical activity . Advanced directives . List of other physicians . Hospitalizations, surgeries, and ER visits in previous 12 months . Vitals . Screenings to include cognitive, depression, and falls . Referrals and appointments  In addition, I have reviewed and discussed with Lunette E Squitieri certain preventive protocols, quality metrics, and best practice recommendations. A written personalized care plan for preventive services as well as general preventive health recommendations is available and can be mailed to the patient at her request.      Liliana Cline  [id number]

## 2020-05-10 LAB — COLOGUARD: COLOGUARD: NEGATIVE

## 2020-05-10 LAB — EXTERNAL GENERIC LAB PROCEDURE: COLOGUARD: NEGATIVE

## 2020-05-14 LAB — COLOGUARD: Cologuard: NEGATIVE

## 2020-06-04 ENCOUNTER — Other Ambulatory Visit: Payer: Self-pay | Admitting: Family Medicine

## 2020-07-13 ENCOUNTER — Other Ambulatory Visit: Payer: Self-pay | Admitting: Family Medicine

## 2020-07-13 DIAGNOSIS — E1169 Type 2 diabetes mellitus with other specified complication: Secondary | ICD-10-CM

## 2020-07-30 NOTE — Progress Notes (Signed)
Subjective: CC: DM PCP: Raliegh Ip, DO MWN:[id number] Sheena Graham is a 69 y.o. female presenting to clinic today for:  1. Type 2 Diabetes w/ HTN, HLD and neuropathy:  Patient is compliant with glipizide, Jentadueto, amlodipine, Zestoretic and Lipitor.  She does not check blood sugars.  However she notes that she is been feeling very well.  Denies any diaphoresis, polydipsia, polyuria, visual disturbance.  She continues to take gabapentin for neuropathy and this seems to be working well.  No edema.  Last eye exam: Scheduled for November Last foot exam: UTD Last A1c:  Lab Results  Component Value Date   HGBA1C 6.4 03/27/2020   Nephropathy screen indicated?:  On ACE inhibitor Last flu, zoster and/or pneumovax: UTD Immunization History  Administered Date(s) Administered  . Influenza, High Dose Seasonal PF 11/27/2017, 09/30/2018  . Influenza, Seasonal, Injecte, Preservative Fre 09/24/2015  . Influenza,inj,Quad PF,6+ Mos 09/11/2016  . Moderna SARS-COVID-2 Vaccination 02/23/2020, 03/22/2020  . Pneumococcal Conjugate-13 06/29/2018  . Pneumococcal Polysaccharide-23 02/15/2016, 04/14/2017  . Tdap 05/01/2014    ROS: Per HPI  No Known Allergies Past Medical History:  Diagnosis Date  . Arthritis   . Diabetes mellitus without complication (HCC)   . Hypertension     Current Outpatient Medications:  .  acetaminophen (TYLENOL) 500 MG tablet, Take 500 mg by mouth every 6 (six) hours as needed for mild pain or moderate pain., Disp: , Rfl:  .  amLODipine (NORVASC) 5 MG tablet, Take 1 tablet (5 mg total) by mouth daily., Disp: 90 tablet, Rfl: 1 .  atorvastatin (LIPITOR) 20 MG tablet, Take 1 tablet (20 mg total) by mouth every evening., Disp: 90 tablet, Rfl: 1 .  colchicine 0.6 MG tablet, TAKE 1 TABLET ONCE DAILY, Disp: 30 tablet, Rfl: 0 .  fluticasone (FLONASE) 50 MCG/ACT nasal spray, Place into the nose daily. , Disp: , Rfl:  .  gabapentin (NEURONTIN) 300 MG capsule, Take 1 capsule (300  mg total) by mouth 3 (three) times daily., Disp: 270 capsule, Rfl: 1 .  glipiZIDE (GLUCOTROL XL) 5 MG 24 hr tablet, Take 1 tablet (5 mg total) by mouth daily with breakfast., Disp: 90 tablet, Rfl: 0 .  glucose blood test strip, Test BS daily Dx E11.42 One touch verio, Disp: 100 each, Rfl: 3 .  linaGLIPtin-metFORMIN HCl ER (JENTADUETO XR) 03-999 MG TB24, Take 1 tablet by mouth daily with breakfast., Disp: 90 tablet, Rfl: 3 .  lisinopril-hydrochlorothiazide (ZESTORETIC) 20-25 MG tablet, Take 1 tablet by mouth daily., Disp: 90 tablet, Rfl: 1 .  metoprolol tartrate (LOPRESSOR) 50 MG tablet, Take 1 tablet (50 mg total) by mouth 2 (two) times daily., Disp: 180 tablet, Rfl: 1 .  omeprazole (PRILOSEC) 20 MG capsule, Take 1 capsule (20 mg total) by mouth daily., Disp: 90 capsule, Rfl: 1 Social History   Socioeconomic History  . Marital status: Widowed    Spouse name: Not on file  . Number of children: 2  . Years of education: 9  . Highest education level: 9th grade  Occupational History  . Not on file  Tobacco Use  . Smoking status: Former Smoker    Years: 15.00    Quit date: 06/24/1999    Years since quitting: 21.1  . Smokeless tobacco: Never Used  Vaping Use  . Vaping Use: Never used  Substance and Sexual Activity  . Alcohol use: No  . Drug use: No  . Sexual activity: Not Currently  Other Topics Concern  . Not on file  Social History  Narrative   Disabled   Her son and grandson live with her   Social Determinants of Health   Financial Resource Strain:   . Difficulty of Paying Living Expenses: Not on file  Food Insecurity: No Food Insecurity  . Worried About Programme researcher, broadcasting/film/video in the Last Year: Never true  . Ran Out of Food in the Last Year: Never true  Transportation Needs: No Transportation Needs  . Lack of Transportation (Medical): No  . Lack of Transportation (Non-Medical): No  Physical Activity: Inactive  . Days of Exercise per Week: 0 days  . Minutes of Exercise per  Session: 0 min  Stress: No Stress Concern Present  . Feeling of Stress : Not at all  Social Connections: Moderately Isolated  . Frequency of Communication with Friends and Family: More than three times a week  . Frequency of Social Gatherings with Friends and Family: More than three times a week  . Attends Religious Services: 1 to 4 times per year  . Active Member of Clubs or Organizations: No  . Attends Banker Meetings: Never  . Marital Status: Widowed  Intimate Partner Violence:   . Fear of Current or Ex-Partner: Not on file  . Emotionally Abused: Not on file  . Physically Abused: Not on file  . Sexually Abused: Not on file   Family History  Problem Relation Age of Onset  . Hypertension Mother   . Cancer Mother        colon  . Hypertension Father   . Hypertension Sister   . Congestive Heart Failure Sister   . Aneurysm Sister   . Hypertension Brother   . Heart attack Brother   . Pulmonary disease Brother     Objective: Office vital signs reviewed. BP 137/77   Pulse 63   Temp 98.6 F (37 C) (Temporal)   Ht 5\' 3"  (1.6 m)   Wt 228 lb (103.4 kg)   SpO2 96%   BMI 40.39 kg/m   Physical Examination:  General: Awake, alert, well nourished, No acute distress HEENT: Normal, sclera white Cardio: regular rate and rhythm, S1S2 heard, 1/6 SEM appreciated at b/l sternal borders Pulm: clear to auscultation bilaterally, no wheezes, rhonchi or rales; normal work of breathing on room air Extremities: warm, well perfused, No edema, cyanosis or clubbing; +1 pulses bilaterally MSK: normal gait and station Skin: dry; intact; no rashes or lesions  Assessment/ Plan: 69 y.o. female  1. DM type 2 with diabetic dyslipidemia (HCC) Slight rise in A1c today but still under control.  Continue current regimen.  Renewals of medications have been sent.  She can follow-up in 4 months, sooner if needed.  She will have her diabetic eye exam results sent to me. - Bayer DCA Hb A1c  Waived - atorvastatin (LIPITOR) 20 MG tablet; Take 1 tablet (20 mg total) by mouth every evening.  Dispense: 90 tablet; Refill: 1 - glipiZIDE (GLUCOTROL XL) 5 MG 24 hr tablet; Take 1 tablet (5 mg total) by mouth daily with breakfast.  Dispense: 90 tablet; Refill: 1  2. Hypertension associated with diabetes (HCC) Controlled - Basic Metabolic Panel - metoprolol tartrate (LOPRESSOR) 50 MG tablet; Take 1 tablet (50 mg total) by mouth 2 (two) times daily.  Dispense: 180 tablet; Refill: 1  3. Diabetic polyneuropathy associated with type 2 diabetes mellitus (HCC) Continue gabapentin - gabapentin (NEURONTIN) 300 MG capsule; Take 1 capsule (300 mg total) by mouth 3 (three) times daily.  Dispense: 270 capsule; Refill: 1  4. Screening mammogram, encounter for Mammogram ordered for [facility] - MM 3D SCREEN BREAST BILATERAL; Future   No orders of the defined types were placed in this encounter.  No orders of the defined types were placed in this encounter.    Raliegh Ip, DO [facility] [phone]

## 2020-07-31 ENCOUNTER — Ambulatory Visit (INDEPENDENT_AMBULATORY_CARE_PROVIDER_SITE_OTHER): Payer: Medicare Other | Admitting: Family Medicine

## 2020-07-31 ENCOUNTER — Other Ambulatory Visit: Payer: Self-pay

## 2020-07-31 ENCOUNTER — Encounter: Payer: Self-pay | Admitting: Family Medicine

## 2020-07-31 VITALS — BP 137/77 | HR 63 | Temp 98.6°F | Ht 63.0 in | Wt 228.0 lb

## 2020-07-31 DIAGNOSIS — Z1231 Encounter for screening mammogram for malignant neoplasm of breast: Secondary | ICD-10-CM

## 2020-07-31 DIAGNOSIS — E1169 Type 2 diabetes mellitus with other specified complication: Secondary | ICD-10-CM | POA: Diagnosis not present

## 2020-07-31 DIAGNOSIS — E1142 Type 2 diabetes mellitus with diabetic polyneuropathy: Secondary | ICD-10-CM | POA: Diagnosis not present

## 2020-07-31 DIAGNOSIS — E785 Hyperlipidemia, unspecified: Secondary | ICD-10-CM

## 2020-07-31 DIAGNOSIS — E1159 Type 2 diabetes mellitus with other circulatory complications: Secondary | ICD-10-CM | POA: Diagnosis not present

## 2020-07-31 DIAGNOSIS — I10 Essential (primary) hypertension: Secondary | ICD-10-CM

## 2020-07-31 LAB — BASIC METABOLIC PANEL
BUN/Creatinine Ratio: 16 (ref 12–28)
BUN: 18 mg/dL (ref 8–27)
CO2: 23 mmol/L (ref 20–29)
Calcium: 10.1 mg/dL (ref 8.7–10.3)
Chloride: 101 mmol/L (ref 96–106)
Creatinine, Ser: 1.12 mg/dL — ABNORMAL HIGH (ref 0.57–1.00)
GFR calc Af Amer: 58 mL/min/{1.73_m2} — ABNORMAL LOW (ref >59)
GFR calc non Af Amer: 51 mL/min/{1.73_m2} — ABNORMAL LOW (ref >59)
Glucose: 130 mg/dL — ABNORMAL HIGH (ref 65–99)
Potassium: 3.9 mmol/L (ref 3.5–5.2)
Sodium: 138 mmol/L (ref 134–144)

## 2020-07-31 LAB — BAYER DCA HB A1C WAIVED: HB A1C (BAYER DCA - WAIVED): 6.7 % (ref <7.0)

## 2020-07-31 MED ORDER — METOPROLOL TARTRATE 50 MG PO TABS: 50.0000 mg | ORAL_TABLET | Freq: Two times a day (BID) | ORAL | 1 refills | Status: DC

## 2020-07-31 MED ORDER — GLIPIZIDE ER 5 MG PO TB24: 5.0000 mg | ORAL_TABLET | Freq: Every day | ORAL | 1 refills | Status: DC

## 2020-07-31 MED ORDER — GABAPENTIN 300 MG PO CAPS: 300.0000 mg | ORAL_CAPSULE | Freq: Three times a day (TID) | ORAL | 1 refills | Status: DC

## 2020-07-31 MED ORDER — ATORVASTATIN CALCIUM 20 MG PO TABS: 20.0000 mg | ORAL_TABLET | Freq: Every evening | ORAL | 1 refills | Status: DC

## 2020-07-31 NOTE — Patient Instructions (Signed)
Sugar looks perfect.  A1c 6.7. Make sure you are watching your sugar/ carbs come the holidays.  We can plan to see each other in 4 months. Have your eye doctor send me records in November

## 2020-10-15 LAB — HM DIABETES EYE EXAM

## 2020-11-21 ENCOUNTER — Ambulatory Visit
Admission: RE | Admit: 2020-11-21 | Discharge: 2020-11-21 | Disposition: A | Discharge status: Home / Self Care | Payer: Medicare Other | Source: Ambulatory Visit | Attending: Family Medicine | Admitting: Family Medicine

## 2020-11-21 ENCOUNTER — Other Ambulatory Visit: Payer: Self-pay

## 2020-11-21 DIAGNOSIS — Z1231 Encounter for screening mammogram for malignant neoplasm of breast: Secondary | ICD-10-CM

## 2020-11-30 ENCOUNTER — Ambulatory Visit: Payer: Medicare Other | Admitting: Family Medicine

## 2020-12-05 ENCOUNTER — Other Ambulatory Visit: Payer: Self-pay | Admitting: Family Medicine

## 2020-12-05 DIAGNOSIS — E1159 Type 2 diabetes mellitus with other circulatory complications: Secondary | ICD-10-CM

## 2020-12-07 ENCOUNTER — Other Ambulatory Visit: Payer: Self-pay

## 2020-12-07 ENCOUNTER — Encounter: Payer: Self-pay | Admitting: Family Medicine

## 2020-12-07 ENCOUNTER — Ambulatory Visit (INDEPENDENT_AMBULATORY_CARE_PROVIDER_SITE_OTHER): Payer: Medicare Other | Admitting: Family Medicine

## 2020-12-07 VITALS — BP 134/69 | HR 67 | Temp 97.9°F | Ht 63.0 in | Wt 221.0 lb

## 2020-12-07 DIAGNOSIS — E785 Hyperlipidemia, unspecified: Secondary | ICD-10-CM

## 2020-12-07 DIAGNOSIS — R0989 Other specified symptoms and signs involving the circulatory and respiratory systems: Secondary | ICD-10-CM

## 2020-12-07 DIAGNOSIS — E1159 Type 2 diabetes mellitus with other circulatory complications: Secondary | ICD-10-CM | POA: Diagnosis not present

## 2020-12-07 DIAGNOSIS — I152 Hypertension secondary to endocrine disorders: Secondary | ICD-10-CM

## 2020-12-07 DIAGNOSIS — E1169 Type 2 diabetes mellitus with other specified complication: Secondary | ICD-10-CM | POA: Diagnosis not present

## 2020-12-07 DIAGNOSIS — E1142 Type 2 diabetes mellitus with diabetic polyneuropathy: Secondary | ICD-10-CM

## 2020-12-07 LAB — BAYER DCA HB A1C WAIVED: HB A1C (BAYER DCA - WAIVED): 6.3 % (ref <7.0)

## 2020-12-07 MED ORDER — LISINOPRIL-HYDROCHLOROTHIAZIDE 20-25 MG PO TABS: 1.0000 | ORAL_TABLET | Freq: Every day | ORAL | 3 refills | Status: DC

## 2020-12-07 MED ORDER — METOPROLOL TARTRATE 50 MG PO TABS: 50.0000 mg | ORAL_TABLET | Freq: Two times a day (BID) | ORAL | 3 refills | Status: DC

## 2020-12-07 MED ORDER — JENTADUETO XR 5-1000 MG PO TB24: 1.0000 | ORAL_TABLET | Freq: Every day | ORAL | 3 refills | Status: DC

## 2020-12-07 MED ORDER — AMLODIPINE BESYLATE 5 MG PO TABS: 5.0000 mg | ORAL_TABLET | Freq: Every day | ORAL | 3 refills | Status: DC

## 2020-12-07 MED ORDER — ATORVASTATIN CALCIUM 20 MG PO TABS: 20.0000 mg | ORAL_TABLET | Freq: Every evening | ORAL | 3 refills | Status: DC

## 2020-12-07 MED ORDER — LORATADINE 10 MG PO TABS: 10.0000 mg | ORAL_TABLET | Freq: Every day | ORAL | 11 refills | Status: DC

## 2020-12-07 MED ORDER — GABAPENTIN 300 MG PO CAPS: 300.0000 mg | ORAL_CAPSULE | Freq: Three times a day (TID) | ORAL | 1 refills | Status: DC

## 2020-12-07 MED ORDER — OMEPRAZOLE 20 MG PO CPDR: 20.0000 mg | DELAYED_RELEASE_CAPSULE | Freq: Every day | ORAL | 3 refills | Status: DC

## 2020-12-07 MED ORDER — GLIPIZIDE ER 5 MG PO TB24: 5.0000 mg | ORAL_TABLET | Freq: Every day | ORAL | 1 refills | Status: DC

## 2020-12-07 MED ORDER — COLCHICINE 0.6 MG PO TABS
0.6000 mg | ORAL_TABLET | Freq: Every day | ORAL | 1 refills | Status: DC
Start: 2020-12-07 — End: 2021-10-22

## 2020-12-07 NOTE — Patient Instructions (Signed)
Sugar looks perfect. Foot exam perfect

## 2020-12-07 NOTE — Progress Notes (Signed)
Subjective: CC: DM PCP: Sheena Graham, Sheena Graham HMC:[id number] Sheena Graham is a 70 y.o. female presenting to clinic today for:  1. Type 2 Diabetes with hypertension, hyperlipidemia, neuropathy:  She reports that she has been doing well.  She is been totally compliant with all of her medications.  Last eye exam: Up-to-date.  Had at my eye doctor.  She brings in a copy of this result today Last foot exam: Needs Last A1c:  Lab Results  Component Value Date   HGBA1C 6.7 07/31/2020   Nephropathy screen indicated?:  On ACE inhibitor Last flu, zoster and/or pneumovax:  Immunization History  Administered Date(s) Administered  . Influenza, High Dose Seasonal PF 11/27/2017, 09/30/2018  . Influenza, Seasonal, Injecte, Preservative Fre 09/24/2015  . Influenza,inj,Quad PF,6+ Mos 09/11/2016  . Moderna Sars-Covid-2 Vaccination 02/23/2020, 03/22/2020  . Pneumococcal Conjugate-13 06/29/2018  . Pneumococcal Polysaccharide-23 02/15/2016, 04/14/2017  . Tdap 05/01/2014    ROS: No visual disturbance, chest pain, shortness of breath.  She has chronic neuropathy that is stable with gabapentin  2.  Chest congestion Patient reports that she has had some chest congestion over the last couple of days.  No fevers, hemoptysis, nausea, vomiting, shortness of breath or wheezing.  She does report some mild rhinorrhea.  No medications used thus far   ROS: Per HPI  No Known Allergies Past Medical History:  Diagnosis Date  . Arthritis   . Diabetes mellitus without complication (HCC)   . Hypertension     Current Outpatient Medications:  .  acetaminophen (TYLENOL) 500 MG tablet, Take 500 mg by mouth every 6 (six) hours as needed for mild pain or moderate pain., Disp: , Rfl:  .  amLODipine (NORVASC) 5 MG tablet, Take 1 tablet (5 mg total) by mouth daily., Disp: 90 tablet, Rfl: 0 .  atorvastatin (LIPITOR) 20 MG tablet, Take 1 tablet (20 mg total) by mouth every evening., Disp: 90 tablet, Rfl: 1 .  colchicine 0.6  MG tablet, TAKE 1 TABLET ONCE DAILY, Disp: 30 tablet, Rfl: 0 .  fluticasone (FLONASE) 50 MCG/ACT nasal spray, Place into the nose daily. , Disp: , Rfl:  .  gabapentin (NEURONTIN) 300 MG capsule, Take 1 capsule (300 mg total) by mouth 3 (three) times daily., Disp: 270 capsule, Rfl: 1 .  glipiZIDE (GLUCOTROL XL) 5 MG 24 hr tablet, Take 1 tablet (5 mg total) by mouth daily with breakfast., Disp: 90 tablet, Rfl: 1 .  glucose blood test strip, Test BS daily Dx E11.42 One touch verio, Disp: 100 each, Rfl: 3 .  linaGLIPtin-metFORMIN HCl ER (JENTADUETO XR) 03-999 MG TB24, Take 1 tablet by mouth daily with breakfast., Disp: 90 tablet, Rfl: 3 .  lisinopril-hydrochlorothiazide (ZESTORETIC) 20-25 MG tablet, Take 1 tablet by mouth daily., Disp: 90 tablet, Rfl: 1 .  metoprolol tartrate (LOPRESSOR) 50 MG tablet, Take 1 tablet (50 mg total) by mouth 2 (two) times daily., Disp: 180 tablet, Rfl: 1 .  omeprazole (PRILOSEC) 20 MG capsule, Take 1 capsule (20 mg total) by mouth daily., Disp: 90 capsule, Rfl: 1 Social History   Socioeconomic History  . Marital status: Widowed    Spouse name: Not on file  . Number of children: 2  . Years of education: 9  . Highest education level: 9th grade  Occupational History  . Not on file  Tobacco Use  . Smoking status: Former Smoker    Years: 15.00    Quit date: 06/24/1999    Years since quitting: 21.4  . Smokeless tobacco: Never  Used  Vaping Use  . Vaping Use: Never used  Substance and Sexual Activity  . Alcohol use: No  . Drug use: No  . Sexual activity: Not Currently  Other Topics Concern  . Not on file  Social History Narrative   Disabled   Her son and grandson live with her   Social Determinants of Health   Financial Resource Strain: Not on file  Food Insecurity: No Food Insecurity  . Worried About Programme researcher, broadcasting/film/video in the Last Year: Never true  . Ran Out of Food in the Last Year: Never true  Transportation Needs: No Transportation Needs  . Lack of  Transportation (Medical): No  . Lack of Transportation (Non-Medical): No  Physical Activity: Inactive  . Days of Exercise per Week: 0 days  . Minutes of Exercise per Session: 0 min  Stress: No Stress Concern Present  . Feeling of Stress : Not at all  Social Connections: Moderately Isolated  . Frequency of Communication with Friends and Family: More than three times a week  . Frequency of Social Gatherings with Friends and Family: More than three times a week  . Attends Religious Services: 1 to 4 times per year  . Active Member of Clubs or Organizations: No  . Attends Banker Meetings: Never  . Marital Status: Widowed  Intimate Partner Violence: Not on file   Family History  Problem Relation Age of Onset  . Hypertension Mother   . Cancer Mother        colon  . Hypertension Father   . Hypertension Sister   . Congestive Heart Failure Sister   . Aneurysm Sister   . Hypertension Brother   . Heart attack Brother   . Pulmonary disease Brother     Objective: Office vital signs reviewed. BP 134/69   Pulse 67   Temp 97.9 F (36.6 C) (Temporal)   Ht 5\' 3"  (1.6 m)   Wt 221 lb (100.2 kg)   SpO2 97%   BMI 39.15 kg/m   Physical Examination:  General: Awake, alert, well nourished, No acute distress HEENT: Normal, sclera white, moist mucus membranes  Cardio: regular rate and rhythm, S1S2 heard, no murmurs appreciated Pulm: clear to auscultation bilaterally, no wheezes, rhonchi or rales; normal work of breathing on room air Extremities: warm, well perfused, No edema, cyanosis or clubbing; +2 pulses bilaterally MSK: normal gait and station Skin: dry; intact; no rashes or lesions Neuro: see DM foot  Diabetic Foot Exam - Simple   Simple Foot Form Diabetic Foot exam was performed with the following findings: Yes 12/07/2020 11:37 AM  Visual Inspection No deformities, no ulcerations, no other skin breakdown bilaterally: Yes Sensation Testing Intact to touch and  monofilament testing bilaterally: Yes Pulse Check Posterior Tibialis and Dorsalis pulse intact bilaterally: Yes Comments      Assessment/ Plan: 70 y.o. female   DM type 2 with diabetic dyslipidemia (HCC) - Plan: Renal Function Panel, Bayer DCA Hb A1c Waived, Lipid Panel, atorvastatin (LIPITOR) 20 MG tablet, glipiZIDE (GLUCOTROL XL) 5 MG 24 hr tablet  Hypertension associated with diabetes (HCC) - Plan: Renal Function Panel, amLODipine (NORVASC) 5 MG tablet, lisinopril-hydrochlorothiazide (ZESTORETIC) 20-25 MG tablet, metoprolol tartrate (LOPRESSOR) 50 MG tablet  Diabetic polyneuropathy associated with type 2 diabetes mellitus (HCC) - Plan: gabapentin (NEURONTIN) 300 MG capsule  Chest congestion  A1c is excellent at 6.3 today.  Continue current regimen.  Check renal function panel, lipid panel  Blood pressures well controlled.  Continue current regimen  Neuropathy is stable.  Foot exam was normal  Chest congestion may be allergic versus seasonal.  No evidence of infection at this point but with current pandemic difficult to tell.  Mucinex, water, Claritin.  We discussed red flag signs and symptoms warranting further evaluation.  She voiced good understanding  No orders of the defined types were placed in this encounter.  Meds ordered this encounter  Medications  . amLODipine (NORVASC) 5 MG tablet    Sig: Take 1 tablet (5 mg total) by mouth daily.    Dispense:  90 tablet    Refill:  3  . atorvastatin (LIPITOR) 20 MG tablet    Sig: Take 1 tablet (20 mg total) by mouth every evening.    Dispense:  90 tablet    Refill:  3  . colchicine 0.6 MG tablet    Sig: Take 1 tablet (0.6 mg total) by mouth daily.    Dispense:  90 tablet    Refill:  1  . gabapentin (NEURONTIN) 300 MG capsule    Sig: Take 1 capsule (300 mg total) by mouth 3 (three) times daily.    Dispense:  270 capsule    Refill:  1  . glipiZIDE (GLUCOTROL XL) 5 MG 24 hr tablet    Sig: Take 1 tablet (5 mg total) by mouth  daily with breakfast.    Dispense:  90 tablet    Refill:  1  . linaGLIPtin-metFORMIN HCl ER (JENTADUETO XR) 03-999 MG TB24    Sig: Take 1 tablet by mouth daily with breakfast.    Dispense:  90 tablet    Refill:  3  . lisinopril-hydrochlorothiazide (ZESTORETIC) 20-25 MG tablet    Sig: Take 1 tablet by mouth daily.    Dispense:  90 tablet    Refill:  3  . metoprolol tartrate (LOPRESSOR) 50 MG tablet    Sig: Take 1 tablet (50 mg total) by mouth 2 (two) times daily.    Dispense:  180 tablet    Refill:  3  . omeprazole (PRILOSEC) 20 MG capsule    Sig: Take 1 capsule (20 mg total) by mouth daily.    Dispense:  90 capsule    Refill:  3  . loratadine (CLARITIN) 10 MG tablet    Sig: Take 1 tablet (10 mg total) by mouth daily.    Dispense:  30 tablet    Refill:  11     Sheena Trentham Hulen Skains, Sheena Graham [facility] [phone]

## 2020-12-08 LAB — RENAL FUNCTION PANEL
Albumin: 4.4 g/dL (ref 3.8–4.8)
BUN/Creatinine Ratio: 22 (ref 12–28)
BUN: 33 mg/dL — ABNORMAL HIGH (ref 8–27)
CO2: 18 mmol/L — ABNORMAL LOW (ref 20–29)
Calcium: 9.7 mg/dL (ref 8.7–10.3)
Chloride: 103 mmol/L (ref 96–106)
Creatinine, Ser: 1.49 mg/dL — ABNORMAL HIGH (ref 0.57–1.00)
GFR calc Af Amer: 41 mL/min/{1.73_m2} — ABNORMAL LOW (ref >59)
GFR calc non Af Amer: 36 mL/min/{1.73_m2} — ABNORMAL LOW (ref >59)
Glucose: 94 mg/dL (ref 65–99)
Phosphorus: 4.2 mg/dL (ref 3.0–4.3)
Potassium: 4.6 mmol/L (ref 3.5–5.2)
Sodium: 141 mmol/L (ref 134–144)

## 2020-12-08 LAB — LIPID PANEL
Chol/HDL Ratio: 3.6 ratio (ref 0.0–4.4)
Cholesterol, Total: 142 mg/dL (ref 100–199)
HDL: 40 mg/dL (ref >39)
LDL Chol Calc (NIH): 82 mg/dL (ref 0–99)
Triglycerides: 111 mg/dL (ref 0–149)
VLDL Cholesterol Cal: 20 mg/dL (ref 5–40)

## 2020-12-26 ENCOUNTER — Other Ambulatory Visit: Payer: Self-pay

## 2020-12-26 DIAGNOSIS — N289 Disorder of kidney and ureter, unspecified: Secondary | ICD-10-CM

## 2021-01-04 ENCOUNTER — Other Ambulatory Visit: Payer: Medicare Other

## 2021-01-04 ENCOUNTER — Other Ambulatory Visit: Payer: Self-pay

## 2021-01-04 DIAGNOSIS — N289 Disorder of kidney and ureter, unspecified: Secondary | ICD-10-CM

## 2021-01-05 LAB — BMP8+EGFR
BUN/Creatinine Ratio: 12 (ref 12–28)
BUN: 11 mg/dL (ref 8–27)
CO2: 20 mmol/L (ref 20–29)
Calcium: 9.4 mg/dL (ref 8.7–10.3)
Chloride: 106 mmol/L (ref 96–106)
Creatinine, Ser: 0.95 mg/dL (ref 0.57–1.00)
GFR calc Af Amer: 71 mL/min/{1.73_m2} (ref >59)
GFR calc non Af Amer: 61 mL/min/{1.73_m2} (ref >59)
Glucose: 125 mg/dL — ABNORMAL HIGH (ref 65–99)
Potassium: 4.6 mmol/L (ref 3.5–5.2)
Sodium: 143 mmol/L (ref 134–144)

## 2021-03-15 ENCOUNTER — Other Ambulatory Visit: Payer: Self-pay | Admitting: Family Medicine

## 2021-03-15 DIAGNOSIS — E1159 Type 2 diabetes mellitus with other circulatory complications: Secondary | ICD-10-CM

## 2021-04-23 ENCOUNTER — Other Ambulatory Visit: Payer: Self-pay | Admitting: Family Medicine

## 2021-04-23 DIAGNOSIS — E785 Hyperlipidemia, unspecified: Secondary | ICD-10-CM

## 2021-04-23 DIAGNOSIS — E1169 Type 2 diabetes mellitus with other specified complication: Secondary | ICD-10-CM

## 2021-06-03 ENCOUNTER — Other Ambulatory Visit: Payer: Self-pay | Admitting: Family Medicine

## 2021-06-03 DIAGNOSIS — I152 Hypertension secondary to endocrine disorders: Secondary | ICD-10-CM

## 2021-06-03 DIAGNOSIS — E1159 Type 2 diabetes mellitus with other circulatory complications: Secondary | ICD-10-CM

## 2021-06-10 ENCOUNTER — Encounter: Payer: Self-pay | Admitting: Family Medicine

## 2021-06-10 ENCOUNTER — Ambulatory Visit (INDEPENDENT_AMBULATORY_CARE_PROVIDER_SITE_OTHER): Payer: Medicare Other | Admitting: Family Medicine

## 2021-06-10 ENCOUNTER — Other Ambulatory Visit: Payer: Self-pay

## 2021-06-10 VITALS — BP 125/71 | HR 72 | Temp 98.4°F | Ht 63.0 in | Wt 219.0 lb

## 2021-06-10 DIAGNOSIS — E1169 Type 2 diabetes mellitus with other specified complication: Secondary | ICD-10-CM | POA: Diagnosis not present

## 2021-06-10 DIAGNOSIS — E1159 Type 2 diabetes mellitus with other circulatory complications: Secondary | ICD-10-CM | POA: Diagnosis not present

## 2021-06-10 DIAGNOSIS — I152 Hypertension secondary to endocrine disorders: Secondary | ICD-10-CM

## 2021-06-10 DIAGNOSIS — M25472 Effusion, left ankle: Secondary | ICD-10-CM | POA: Diagnosis not present

## 2021-06-10 DIAGNOSIS — E785 Hyperlipidemia, unspecified: Secondary | ICD-10-CM

## 2021-06-10 DIAGNOSIS — M722 Plantar fascial fibromatosis: Secondary | ICD-10-CM

## 2021-06-10 DIAGNOSIS — E1142 Type 2 diabetes mellitus with diabetic polyneuropathy: Secondary | ICD-10-CM | POA: Diagnosis not present

## 2021-06-10 DIAGNOSIS — Z0000 Encounter for general adult medical examination without abnormal findings: Secondary | ICD-10-CM

## 2021-06-10 LAB — BAYER DCA HB A1C WAIVED: HB A1C (BAYER DCA - WAIVED): 6.4 %

## 2021-06-10 MED ORDER — ALPHA-LIPOIC ACID 600 MG PO CAPS
600.0000 mg | ORAL_CAPSULE | Freq: Every day | ORAL | 3 refills | Status: DC
Start: 2021-06-10 — End: 2022-07-01

## 2021-06-10 NOTE — Progress Notes (Signed)
Subjective: CC: Annual physical, DM PCP: Raliegh Ip, DO NIO:[id number] E Sheena Graham is a 70 y.o. female presenting to clinic today for:  1. Type 2 Diabetes with hypertension, hyperlipidemia and neuropathy:  Reports compliance with medications.    Last eye exam: UTD Last foot exam: UTD Last A1c:  Lab Results  Component Value Date   HGBA1C 6.3 12/07/2020   Nephropathy screen indicated?: UTD Last flu, zoster and/or pneumovax:  Immunization History  Administered Date(s) Administered   Influenza, High Dose Seasonal PF 11/27/2017, 09/30/2018   Influenza, Seasonal, Injecte, Preservative Fre 09/24/2015   Influenza,inj,Quad PF,6+ Mos 09/11/2016   Moderna Sars-Covid-2 Vaccination 02/23/2020, 03/22/2020   Pneumococcal Conjugate-13 06/29/2018   Pneumococcal Polysaccharide-23 02/15/2016, 04/14/2017   Tdap 05/01/2014    ROS: Denies dizziness, LOC, polyuria, polydipsia, unintended weight loss/gain, foot ulcerations, shortness of breath or chest pain. Reports burning in her feet.  Has been using colchicine in efforts to relieve.  Admits to left sided heel pain and left medial ankle pain intermittently.  Has been stretching her foot, rubbing her foot and using rubbing alcohol to relieve.  Has occurred in past but resolved.  Worse with getting up from seated position.  Trying to use supportive footwear.  Stopped wearing heels.    ROS: Per HPI.  10 point ROS otherwise negative.  No Known Allergies Past Medical History:  Diagnosis Date   Arthritis    Diabetes mellitus without complication (HCC)    Hypertension     Current Outpatient Medications:    acetaminophen (TYLENOL) 500 MG tablet, Take 500 mg by mouth every 6 (six) hours as needed for mild pain or moderate pain., Disp: , Rfl:    amLODipine (NORVASC) 5 MG tablet, Take 1 tablet (5 mg total) by mouth daily., Disp: 90 tablet, Rfl: 3   atorvastatin (LIPITOR) 20 MG tablet, Take 1 tablet (20 mg total) by mouth every evening., Disp: 90  tablet, Rfl: 3   colchicine 0.6 MG tablet, Take 1 tablet (0.6 mg total) by mouth daily., Disp: 90 tablet, Rfl: 1   fluticasone (FLONASE) 50 MCG/ACT nasal spray, Place into the nose daily. , Disp: , Rfl:    gabapentin (NEURONTIN) 300 MG capsule, Take 1 capsule (300 mg total) by mouth 3 (three) times daily., Disp: 270 capsule, Rfl: 1   glipiZIDE (GLUCOTROL XL) 5 MG 24 hr tablet, Take 1 tablet (5 mg total) by mouth daily with breakfast., Disp: 90 tablet, Rfl: 1   glucose blood test strip, Test BS daily Dx E11.42 One touch verio, Disp: 100 each, Rfl: 3   linaGLIPtin-metFORMIN HCl ER (JENTADUETO XR) 03-999 MG TB24, Take 1 tablet by mouth daily with breakfast., Disp: 90 tablet, Rfl: 3   lisinopril-hydrochlorothiazide (ZESTORETIC) 20-25 MG tablet, Take 1 tablet by mouth daily., Disp: 90 tablet, Rfl: 3   loratadine (CLARITIN) 10 MG tablet, Take 1 tablet (10 mg total) by mouth daily., Disp: 30 tablet, Rfl: 11   metoprolol tartrate (LOPRESSOR) 50 MG tablet, Take 1 tablet 2 (two) times daily., Disp: 180 tablet, Rfl: 0   omeprazole (PRILOSEC) 20 MG capsule, Take 1 capsule (20 mg total) by mouth daily., Disp: 90 capsule, Rfl: 3 Social History   Socioeconomic History   Marital status: Widowed    Spouse name: Not on file   Number of children: 2   Years of education: 9   Highest education level: 9th grade  Occupational History   Not on file  Tobacco Use   Smoking status: Former    Years: 15.00  Types: Cigarettes    Quit date: 06/24/1999    Years since quitting: 21.9   Smokeless tobacco: Never  Vaping Use   Vaping Use: Never used  Substance and Sexual Activity   Alcohol use: No   Drug use: No   Sexual activity: Not Currently  Other Topics Concern   Not on file  Social History Narrative   Disabled   Her son and grandson live with her   Social Determinants of Health   Financial Resource Strain: Not on file  Food Insecurity: Not on file  Transportation Needs: Not on file  Physical Activity:  Not on file  Stress: Not on file  Social Connections: Not on file  Intimate Partner Violence: Not on file   Family History  Problem Relation Age of Onset   Hypertension Mother    Cancer Mother        colon   Hypertension Father    Hypertension Sister    Congestive Heart Failure Sister    Aneurysm Sister    Hypertension Brother    Heart attack Brother    Pulmonary disease Brother     Objective: Office vital signs reviewed. BP 125/71   Pulse 72   Temp 98.4 F (36.9 C)   Ht 5\' 3"  (1.6 m)   Wt 219 lb (99.3 kg)   SpO2 93%   BMI 38.79 kg/m   Physical Examination:  General: Awake, alert, well nourished, No acute distress HEENT: Normal    Neck: No masses palpated. No lymphadenopathy    Ears: Tympanic membranes intact, normal light reflex, no erythema, no bulging    Eyes: PERRLA, extraocular membranes intact, sclera white    Nose: nasal turbinates moist, clear nasal discharge    Throat: moist mucus membranes, no erythema, no tonsillar exudate.  Airway is patent. Teeth absent Cardio: regular rate and rhythm, S1S2 heard, 2/6 SEM appreciated that radiates to carotids Pulm: clear to auscultation bilaterally, no wheezes, rhonchi or rales; normal work of breathing on room air GI: soft, non-tender, non-distended, bowel sounds present x4, no hepatomegaly, no splenomegaly, no masses Extremities: warm, well perfused, No edema, cyanosis or clubbing; +2 pulses bilaterally MSK: antalgic gait and normal station  Left heel: point tenderness to the plantar aspect of the calcaneous. No palpable abnormalities. No associated ankle swelling today Skin: dry; intact; no rashes or lesions Neuro: AOx3 Depression screen [facility] 2/9 06/10/2021 12/07/2020 07/31/2020  Decreased Interest 0 0 0  Down, Depressed, Hopeless 0 0 0  PHQ - 2 Score 0 0 0  Altered sleeping - 0 0  Tired, decreased energy - 0 0  Change in appetite - 0 0  Feeling bad or failure about yourself  - 0 0  Trouble concentrating - 0 0   Moving slowly or fidgety/restless - 0 0  Suicidal thoughts - 0 0  PHQ-9 Score - 0 0   GAD 7 : Generalized Anxiety Score 06/10/2021  Nervous, Anxious, on Edge 0  Control/stop worrying 0  Worry too much - different things 0  Trouble relaxing 0  Restless 0  Easily annoyed or irritable 0  Afraid - awful might happen 0  Total GAD 7 Score 0  Anxiety Difficulty Not difficult at all   Assessment/ Plan: 70 y.o. female   Annual physical exam  DM type 2 with diabetic dyslipidemia (HCC) - Plan: Renal Function Panel, Bayer DCA Hb A1c Waived  Hypertension associated with diabetes (HCC) - Plan: Renal Function Panel  Diabetic polyneuropathy associated with type 2 diabetes mellitus (  HCC) - Plan: Alpha-Lipoic Acid 600 MG CAPS  Left ankle swelling - Plan: Uric Acid, DG Foot Complete Left  Plantar fasciitis of left foot - Plan: DG Foot Complete Left  UTD preventative health care  Sugar well controlled today. No changes  BP well controlled. No changes  Add Alpha lipoic acid for DM neuropathy.  Save colchicine for gout only  No evidence of swelling today but wonder if she is having altered gait due to plantar fasciitis.  Home PT provided.  Encouraged use of ice to affected area. Offered foot/ ankle referral if desired going forward. Might benefit from injection therapy +/- custom orthotics.  No orders of the defined types were placed in this encounter.  No orders of the defined types were placed in this encounter.  Raliegh Ip, DO [facility] [phone]

## 2021-06-10 NOTE — Patient Instructions (Signed)
Alpha lipoic acid for nerve pain in feet added today.  Take only as directed.  Use colchicine only when having a gout flare.  Xrays ordered today to look at your left heel.  Do the exercises I gave you and let me know if you want to see the foot and ankle doctor  You had labs performed today.  You will be contacted with the results of the labs once they are available, usually in the next 3 business days for routine lab work.  If you have an active my chart account, they will be released to your MyChart.  If you prefer to have these labs released to you via telephone, please let us know.   Preventive Care 10 Years and Older, Female Preventive care refers to lifestyle choices and visits with your health care provider that can promote health and wellness. This includes: A yearly physical exam. This is also called an annual wellness visit. Regular dental and eye exams. Immunizations. Screening for certain conditions. Healthy lifestyle choices, such as: Eating a healthy diet. Getting regular exercise. Not using drugs or products that contain nicotine and tobacco. Limiting alcohol use. What can I expect for my preventive care visit? Physical exam Your health care provider will check your: Height and weight. These may be used to calculate your BMI (body mass index). BMI is a measurement that tells if you are at a healthy weight. Heart rate and blood pressure. Body temperature. Skin for abnormal spots. Counseling Your health care provider may ask you questions about your: Past medical problems. Family's medical history. Alcohol, tobacco, and drug use. Emotional well-being. Home life and relationship well-being. Sexual activity. Diet, exercise, and sleep habits. History of falls. Memory and ability to understand (cognition). Work and work Statistician. Pregnancy and menstrual history. Access to firearms. What immunizations do I need?  Vaccines are usually given at various ages,  according to a schedule. Your health care provider will recommend vaccines for you based on your age, medicalhistory, and lifestyle or other factors, such as travel or where you work. What tests do I need? Blood tests Lipid and cholesterol levels. These may be checked every 5 years, or more often depending on your overall health. Hepatitis C test. Hepatitis B test. Screening Lung cancer screening. You may have this screening every year starting at age 17 if you have a 30-pack-year history of smoking and currently smoke or have quit within the past 15 years. Colorectal cancer screening. All adults should have this screening starting at age 34 and continuing until age 25. Your health care provider may recommend screening at age 42 if you are at increased risk. You will have tests every 1-10 years, depending on your results and the type of screening test. Diabetes screening. This is done by checking your blood sugar (glucose) after you have not eaten for a while (fasting). You may have this done every 1-3 years. Mammogram. This may be done every 1-2 years. Talk with your health care provider about how often you should have regular mammograms. Abdominal aortic aneurysm (AAA) screening. You may need this if you are a current or former smoker. BRCA-related cancer screening. This may be done if you have a family history of breast, ovarian, tubal, or peritoneal cancers. Other tests STD (sexually transmitted disease) testing, if you are at risk. Bone density scan. This is done to screen for osteoporosis. You may have this done starting at age 81. Talk with your health care provider about your test results, treatment options,and  if necessary, the need for more tests. Follow these instructions at home: Eating and drinking  Eat a diet that includes fresh fruits and vegetables, whole grains, lean protein, and low-fat dairy products. Limit your intake of foods with high amounts of sugar, saturated fats,  and salt. Take vitamin and mineral supplements as recommended by your health care provider. Do not drink alcohol if your health care provider tells you not to drink. If you drink alcohol: Limit how much you have to 0-1 drink a day. Be aware of how much alcohol is in your drink. In the [place] one drink equals one 12 oz bottle of beer (355 mL), one 5 oz glass of wine (148 mL), or one 1 oz glass of hard liquor (44 mL).  Lifestyle Take daily care of your teeth and gums. Brush your teeth every morning and night with fluoride toothpaste. Floss one time each day. Stay active. Exercise for at least 30 minutes 5 or more days each week. Do not use any products that contain nicotine or tobacco, such as cigarettes, e-cigarettes, and chewing tobacco. If you need help quitting, ask your health care provider. Do not use drugs. If you are sexually active, practice safe sex. Use a condom or other form of protection in order to prevent STIs (sexually transmitted infections). Talk with your health care provider about taking a low-dose aspirin or statin. Find healthy ways to cope with stress, such as: Meditation, yoga, or listening to music. Journaling. Talking to a trusted person. Spending time with friends and family. Safety Always wear your seat belt while driving or riding in a vehicle. Do not drive: If you have been drinking alcohol. Do not ride with someone who has been drinking. When you are tired or distracted. While texting. Wear a helmet and other protective equipment during sports activities. If you have firearms in your house, make sure you follow all gun safety procedures. What's next? Visit your health care provider once a year for an annual wellness visit. Ask your health care provider how often you should have your eyes and teeth checked. Stay up to date on all vaccines. This information is not intended to replace advice given to you by your health care provider. Make sure you discuss any  questions you have with your healthcare provider. Document Revised: 10/31/2020 Document Reviewed: 11/04/2018 Elsevier Patient Education  2022 [employer].

## 2021-06-11 LAB — RENAL FUNCTION PANEL
Albumin: 4.5 g/dL (ref 3.8–4.8)
BUN/Creatinine Ratio: 15 (ref 12–28)
BUN: 22 mg/dL (ref 8–27)
CO2: 24 mmol/L (ref 20–29)
Calcium: 9.9 mg/dL (ref 8.7–10.3)
Chloride: 99 mmol/L (ref 96–106)
Creatinine, Ser: 1.42 mg/dL — ABNORMAL HIGH (ref 0.57–1.00)
Glucose: 130 mg/dL — ABNORMAL HIGH (ref 65–99)
Phosphorus: 3.9 mg/dL (ref 3.0–4.3)
Potassium: 4.1 mmol/L (ref 3.5–5.2)
Sodium: 138 mmol/L (ref 134–144)
eGFR: 40 mL/min/{1.73_m2} — ABNORMAL LOW (ref >59)

## 2021-06-11 LAB — URIC ACID: Uric Acid: 8.8 mg/dL — ABNORMAL HIGH (ref 3.0–7.2)

## 2021-06-14 ENCOUNTER — Telehealth: Payer: Self-pay | Admitting: Family Medicine

## 2021-09-16 ENCOUNTER — Other Ambulatory Visit: Payer: Self-pay | Admitting: Family Medicine

## 2021-09-16 DIAGNOSIS — E1159 Type 2 diabetes mellitus with other circulatory complications: Secondary | ICD-10-CM

## 2021-09-16 DIAGNOSIS — I152 Hypertension secondary to endocrine disorders: Secondary | ICD-10-CM

## 2021-10-22 ENCOUNTER — Other Ambulatory Visit: Payer: Self-pay | Admitting: Family Medicine

## 2021-10-22 DIAGNOSIS — E785 Hyperlipidemia, unspecified: Secondary | ICD-10-CM

## 2021-10-22 DIAGNOSIS — E1169 Type 2 diabetes mellitus with other specified complication: Secondary | ICD-10-CM

## 2021-12-11 ENCOUNTER — Ambulatory Visit (INDEPENDENT_AMBULATORY_CARE_PROVIDER_SITE_OTHER): Payer: Medicare Other | Admitting: Family Medicine

## 2021-12-11 ENCOUNTER — Encounter: Payer: Self-pay | Admitting: Family Medicine

## 2021-12-11 VITALS — BP 133/83 | HR 67 | Temp 98.3°F | Ht 63.0 in | Wt 220.8 lb

## 2021-12-11 DIAGNOSIS — E1142 Type 2 diabetes mellitus with diabetic polyneuropathy: Secondary | ICD-10-CM | POA: Diagnosis not present

## 2021-12-11 DIAGNOSIS — E1169 Type 2 diabetes mellitus with other specified complication: Secondary | ICD-10-CM

## 2021-12-11 DIAGNOSIS — E785 Hyperlipidemia, unspecified: Secondary | ICD-10-CM

## 2021-12-11 DIAGNOSIS — Z23 Encounter for immunization: Secondary | ICD-10-CM

## 2021-12-11 DIAGNOSIS — E1159 Type 2 diabetes mellitus with other circulatory complications: Secondary | ICD-10-CM

## 2021-12-11 DIAGNOSIS — N1832 Chronic kidney disease, stage 3b: Secondary | ICD-10-CM

## 2021-12-11 DIAGNOSIS — I152 Hypertension secondary to endocrine disorders: Secondary | ICD-10-CM

## 2021-12-11 LAB — BAYER DCA HB A1C WAIVED: HB A1C (BAYER DCA - WAIVED): 6 % — ABNORMAL HIGH (ref 4.8–5.6)

## 2021-12-11 MED ORDER — GLIPIZIDE ER 5 MG PO TB24: 5.0000 mg | ORAL_TABLET | Freq: Every day | ORAL | 3 refills | Status: DC

## 2021-12-11 MED ORDER — METOPROLOL TARTRATE 50 MG PO TABS: ORAL_TABLET | ORAL | 3 refills | Status: DC

## 2021-12-11 MED ORDER — GABAPENTIN 300 MG PO CAPS: 300.0000 mg | ORAL_CAPSULE | Freq: Three times a day (TID) | ORAL | 1 refills | Status: DC

## 2021-12-11 MED ORDER — LISINOPRIL-HYDROCHLOROTHIAZIDE 20-25 MG PO TABS: 1.0000 | ORAL_TABLET | Freq: Every day | ORAL | 3 refills | Status: DC

## 2021-12-11 MED ORDER — JENTADUETO XR 5-1000 MG PO TB24: 1.0000 | ORAL_TABLET | Freq: Every day | ORAL | 3 refills | Status: DC

## 2021-12-11 MED ORDER — COLCHICINE 0.6 MG PO TABS: 0.6000 mg | ORAL_TABLET | Freq: Every day | ORAL | 3 refills | Status: DC

## 2021-12-11 MED ORDER — ATORVASTATIN CALCIUM 20 MG PO TABS: 20.0000 mg | ORAL_TABLET | Freq: Every evening | ORAL | 3 refills | Status: DC

## 2021-12-11 MED ORDER — OMEPRAZOLE 20 MG PO CPDR: 20.0000 mg | DELAYED_RELEASE_CAPSULE | Freq: Every day | ORAL | 3 refills | Status: DC

## 2021-12-11 MED ORDER — AMLODIPINE BESYLATE 5 MG PO TABS: 5.0000 mg | ORAL_TABLET | Freq: Every day | ORAL | 3 refills | Status: DC

## 2021-12-11 NOTE — Patient Instructions (Signed)

## 2021-12-11 NOTE — Progress Notes (Signed)
Subjective: CC:Dm PCP: Janora Norlander, DO ZNB:VAPOLID E Herschberger is a 71 y.o. female presenting to clinic today for:  1. Type 2 Diabetes with hypertension, hyperlipidemia and CKD3b:  She is compliant with her medication.  Denies any concerning symptoms or signs.  No hypoglycemic episodes.  Had to reschedule her eye exam.  Last eye exam: needs Last foot exam: needs Last A1c:  Lab Results  Component Value Date   HGBA1C 6.4 06/10/2021   Nephropathy screen indicated?:  Has CKD and is on an ACE inhibitor Last flu, zoster and/or pneumovax:  Immunization History  Administered Date(s) Administered   Influenza, High Dose Seasonal PF 11/27/2017, 09/30/2018   Influenza, Seasonal, Injecte, Preservative Fre 09/24/2015   Influenza,inj,Quad PF,6+ Mos 09/11/2016   Influenza-Unspecified 11/27/2017, 09/30/2018   Moderna Sars-Covid-2 Vaccination 02/23/2020, 03/22/2020   Pneumococcal Conjugate-13 06/29/2018   Pneumococcal Polysaccharide-23 02/15/2016, 04/14/2017   Tdap 05/01/2014    ROS: Denies dizziness, LOC, polyuria, polydipsia, unintended weight loss/gain, foot ulcerations, shortness of breath or chest pain.  Reports neuropathy is well controlled with alpha lipoic acid and gabapentin   ROS: Per HPI  No Known Allergies Past Medical History:  Diagnosis Date   Arthritis    Diabetes mellitus without complication ([place])    Hypertension     Current Outpatient Medications:    acetaminophen (TYLENOL) 500 MG tablet, Take 500 mg by mouth every 6 (six) hours as needed for mild pain or moderate pain., Disp: , Rfl:    Alpha-Lipoic Acid 600 MG CAPS, Take 1 capsule (600 mg total) by mouth daily. For diabetic neuropathy in feet, Disp: 90 capsule, Rfl: 3   amLODipine (NORVASC) 5 MG tablet, Take 1 tablet (5 mg total) by mouth daily., Disp: 90 tablet, Rfl: 3   atorvastatin (LIPITOR) 20 MG tablet, Take 1 tablet (20 mg total) by mouth every evening., Disp: 90 tablet, Rfl: 3   colchicine 0.6 MG tablet,  TAKE 1 TABLET DAILY, Disp: 90 tablet, Rfl: 0   fluticasone (FLONASE) 50 MCG/ACT nasal spray, Place into the nose daily. , Disp: , Rfl:    gabapentin (NEURONTIN) 300 MG capsule, Take 1 capsule (300 mg total) by mouth 3 (three) times daily., Disp: 270 capsule, Rfl: 1   glipiZIDE (GLUCOTROL XL) 5 MG 24 hr tablet, TAKE 1 TABLET DAILY WITH BREAKFAST, Disp: 90 tablet, Rfl: 0   glucose blood test strip, Test BS daily Dx E11.42 One touch verio, Disp: 100 each, Rfl: 3   linaGLIPtin-metFORMIN HCl ER (JENTADUETO XR) 03-999 MG TB24, Take 1 tablet by mouth daily with breakfast., Disp: 90 tablet, Rfl: 3   lisinopril-hydrochlorothiazide (ZESTORETIC) 20-25 MG tablet, Take 1 tablet by mouth daily., Disp: 90 tablet, Rfl: 3   loratadine (CLARITIN) 10 MG tablet, Take 1 tablet (10 mg total) by mouth daily., Disp: 30 tablet, Rfl: 11   metoprolol tartrate (LOPRESSOR) 50 MG tablet, Take 1 tablet 2 (two) times daily., Disp: 180 tablet, Rfl: 0   omeprazole (PRILOSEC) 20 MG capsule, Take 1 capsule (20 mg total) by mouth daily., Disp: 90 capsule, Rfl: 3 Social History   Socioeconomic History   Marital status: Widowed    Spouse name: Not on file   Number of children: 2   Years of education: 9   Highest education level: 9th grade  Occupational History   Not on file  Tobacco Use   Smoking status: Former    Years: 15.00    Types: Cigarettes    Quit date: 06/24/1999    Years since quitting: 84.4  Smokeless tobacco: Never  Vaping Use   Vaping Use: Never used  Substance and Sexual Activity   Alcohol use: No   Drug use: No   Sexual activity: Not Currently  Other Topics Concern   Not on file  Social History Narrative   Disabled   Her son and grandson live with her   Social Determinants of Health   Financial Resource Strain: Not on file  Food Insecurity: Not on file  Transportation Needs: Not on file  Physical Activity: Not on file  Stress: Not on file  Social Connections: Not on file  Intimate Partner  Violence: Not on file   Family History  Problem Relation Age of Onset   Hypertension Mother    Cancer Mother        colon   Hypertension Father    Hypertension Sister    Congestive Heart Failure Sister    Aneurysm Sister    Hypertension Brother    Heart attack Brother    Pulmonary disease Brother     Objective: Office vital signs reviewed. BP 133/83    Pulse 67    Temp 98.3 F (36.8 C)    Ht _0  (1.6 m)    Wt 220 lb 12.8 oz (100.2 kg)    SpO2 98%    BMI 39.11 kg/m   Physical Examination:  General: Awake, alert, well nourished, No acute distress HEENT: Sclera white.  Moist mucous membranes Cardio: regular rate and rhythm, S1S2 heard, no murmurs appreciated Pulm: clear to auscultation bilaterally, no wheezes, rhonchi or rales; normal work of breathing on room air Extremities: warm, well perfused, No edema, cyanosis or clubbing; +2 pulses bilaterally MSK: Normal gait and station Neuro: see DM foot  Diabetic Foot Exam - Simple   Simple Foot Form Diabetic Foot exam was performed with the following findings: Yes 12/11/2021  9:54 AM  Visual Inspection No deformities, no ulcerations, no other skin breakdown bilaterally: Yes Sensation Testing Intact to touch and monofilament testing bilaterally: Yes Pulse Check Posterior Tibialis and Dorsalis pulse intact bilaterally: Yes Comments     Assessment/ Plan: 71 y.o. female   DM type 2 with diabetic dyslipidemia (HCC) - Plan: Bayer DCA Hb A1c Waived, CMP14+EGFR, Lipid Panel, atorvastatin (LIPITOR) 20 MG tablet, glipiZIDE (GLUCOTROL XL) 5 MG 24 hr tablet, linaGLIPtin-metFORMIN HCl ER (JENTADUETO XR) 03-999 MG TB24  Hypertension associated with diabetes ([place]) - Plan: CMP14+EGFR, amLODipine (NORVASC) 5 MG tablet, lisinopril-hydrochlorothiazide (ZESTORETIC) 20-25 MG tablet, metoprolol tartrate (LOPRESSOR) 50 MG tablet  Diabetic polyneuropathy associated with type 2 diabetes mellitus (HCC) - Plan: gabapentin (NEURONTIN) 300 MG  capsule  Stage 3b chronic kidney disease ([place]) - Plan: CMP14+EGFR, CBC, VITAMIN D 25 Hydroxy (Vit-D Deficiency, Fractures)  Sugar under excellent control with A1c of 6.0 today.  Continue current regimen.  May follow-up in 6 months for annual physical and repeat labs.  She will have her eye exam done.  Foot exam was performed today and revealed no abnormalities  Blood pressure under good control.  Continue current regimen  Neuropathy stable with gabapentin and alpha lipoic acid use  Her last renal function panel showed stage IIIb CKD but it seems to wax and wane between normal and CKD.  We will recheck kidney function today.  Influenza and 1st shingles vaccination administered today.  No orders of the defined types were placed in this encounter.  No orders of the defined types were placed in this encounter.  Janora Norlander, DO [place] [phone]

## 2021-12-12 LAB — LIPID PANEL
Chol/HDL Ratio: 3.1 ratio (ref 0.0–4.4)
Cholesterol, Total: 158 mg/dL (ref 100–199)
HDL: 51 mg/dL (ref >39)
LDL Chol Calc (NIH): 87 mg/dL (ref 0–99)
Triglycerides: 112 mg/dL (ref 0–149)
VLDL Cholesterol Cal: 20 mg/dL (ref 5–40)

## 2021-12-12 LAB — CMP14+EGFR
ALT: 22 IU/L (ref 0–32)
AST: 27 IU/L (ref 0–40)
Albumin/Globulin Ratio: 1.8 (ref 1.2–2.2)
Albumin: 4.6 g/dL (ref 3.8–4.8)
Alkaline Phosphatase: 52 IU/L (ref 44–121)
BUN/Creatinine Ratio: 23 (ref 12–28)
BUN: 19 mg/dL (ref 8–27)
Bilirubin Total: 0.4 mg/dL (ref 0.0–1.2)
CO2: 22 mmol/L (ref 20–29)
Calcium: 10.3 mg/dL (ref 8.7–10.3)
Chloride: 99 mmol/L (ref 96–106)
Creatinine, Ser: 0.81 mg/dL (ref 0.57–1.00)
Globulin, Total: 2.6 g/dL (ref 1.5–4.5)
Glucose: 108 mg/dL — ABNORMAL HIGH (ref 70–99)
Potassium: 4.3 mmol/L (ref 3.5–5.2)
Sodium: 138 mmol/L (ref 134–144)
Total Protein: 7.2 g/dL (ref 6.0–8.5)
eGFR: 79 mL/min/{1.73_m2} (ref >59)

## 2021-12-12 LAB — CBC
Hematocrit: 39.7 % (ref 34.0–46.6)
Hemoglobin: 13.4 g/dL (ref 11.1–15.9)
MCH: 28.9 pg (ref 26.6–33.0)
MCHC: 33.8 g/dL (ref 31.5–35.7)
MCV: 86 fL (ref 79–97)
Platelets: 198 10*3/uL (ref 150–450)
RBC: 4.63 x10E6/uL (ref 3.77–5.28)
RDW: 13.2 % (ref 11.7–15.4)
WBC: 5.4 10*3/uL (ref 3.4–10.8)

## 2021-12-12 LAB — VITAMIN D 25 HYDROXY (VIT D DEFICIENCY, FRACTURES): Vit D, 25-Hydroxy: 18.3 ng/mL — ABNORMAL LOW (ref 30.0–100.0)

## 2021-12-13 ENCOUNTER — Other Ambulatory Visit: Payer: Self-pay | Admitting: Family Medicine

## 2021-12-13 DIAGNOSIS — E559 Vitamin D deficiency, unspecified: Secondary | ICD-10-CM

## 2021-12-13 MED ORDER — VITAMIN D (ERGOCALCIFEROL) 1.25 MG (50000 UNIT) PO CAPS: 50000.0000 [IU] | ORAL_CAPSULE | ORAL | 0 refills | Status: AC

## 2021-12-16 ENCOUNTER — Other Ambulatory Visit: Payer: Self-pay | Admitting: Family Medicine

## 2022-02-25 ENCOUNTER — Other Ambulatory Visit: Payer: Self-pay | Admitting: Family Medicine

## 2022-02-25 DIAGNOSIS — E559 Vitamin D deficiency, unspecified: Secondary | ICD-10-CM

## 2022-02-25 NOTE — Telephone Encounter (Signed)
Last OV 12/13/2021. Last RF 12/13/2021 #12 no RF. Next OV 06/10/2022. Vit D checked 12/11/2021 - 18.3 ? ?

## 2022-02-25 NOTE — Telephone Encounter (Signed)
As per rx directions: "THEN START 800 INTERNATIONAL UNITS OF VIT D OVER-THE-COUNTER" ?

## 2022-02-25 NOTE — Telephone Encounter (Signed)
Called patient lmtcb 

## 2022-04-08 DIAGNOSIS — Z0289 Encounter for other administrative examinations: Secondary | ICD-10-CM

## 2022-06-06 NOTE — Patient Instructions (Incomplete)
Our records indicate that you are due for your annual mammogram/breast imaging. While there is no way to prevent breast cancer, early detection provides the best opportunity for curing it. For women over the age of 65, the [facility] recommends a yearly clinical breast exam and a yearly mammogram. These practices have saved thousands of lives. We need your help to ensure that you are receiving optimal medical care. Please call the imaging location that has done you previous mammograms. Please remember to list [place] as your primary care. This helps make sure we receive a report and can update your chart.  Below is the contact information for several local breast imaging centers. You may call the location that works best for you, and they will be happy to assistance in making you an appointment. You do not need an order for a regular screening mammogram. However, if you are having any problems or concerns with you breast area, please let your primary care provider know, and appropriate orders will be placed. Please let our office know if you have any questions or concerns. Or if you need information for another imaging center not on this list or outside of the area. We are commented to working with you on your health care journey.   The mobile unit/bus ([facility]) - they come twice a month to our location.  These appointments can be made through our office or by call [facility] [place] [facility]  [place], [place] [address] Phone [phone]  [facility] Radiology Department  [address] [place], [place] [address] [phone]  [facility] ([facility] of [place])  [phone] [address], [place] [address] [phone]  [place] [address] [place] [address] [phone]  [place]  [address] [place] [address] [phone]  [facility] Mammography in [place], [place] [address] [phone]  [facility] [place], [place] [address] [phone]  [facility] at [facility] [place]  [place],  [address] [phone]  [place] [address] [phone]

## 2022-06-10 ENCOUNTER — Ambulatory Visit (INDEPENDENT_AMBULATORY_CARE_PROVIDER_SITE_OTHER): Payer: Medicare Other | Admitting: Family Medicine

## 2022-06-10 ENCOUNTER — Encounter: Payer: Self-pay | Admitting: Family Medicine

## 2022-06-10 ENCOUNTER — Ambulatory Visit (INDEPENDENT_AMBULATORY_CARE_PROVIDER_SITE_OTHER): Payer: Medicare Other

## 2022-06-10 VITALS — BP 148/78 | HR 61 | Temp 97.2°F | Ht 63.0 in | Wt 219.8 lb

## 2022-06-10 DIAGNOSIS — E559 Vitamin D deficiency, unspecified: Secondary | ICD-10-CM

## 2022-06-10 DIAGNOSIS — I152 Hypertension secondary to endocrine disorders: Secondary | ICD-10-CM

## 2022-06-10 DIAGNOSIS — E785 Hyperlipidemia, unspecified: Secondary | ICD-10-CM

## 2022-06-10 DIAGNOSIS — I350 Nonrheumatic aortic (valve) stenosis: Secondary | ICD-10-CM | POA: Insufficient documentation

## 2022-06-10 DIAGNOSIS — E1169 Type 2 diabetes mellitus with other specified complication: Secondary | ICD-10-CM | POA: Diagnosis not present

## 2022-06-10 DIAGNOSIS — Z0000 Encounter for general adult medical examination without abnormal findings: Secondary | ICD-10-CM

## 2022-06-10 DIAGNOSIS — Z780 Asymptomatic menopausal state: Secondary | ICD-10-CM

## 2022-06-10 DIAGNOSIS — Z23 Encounter for immunization: Secondary | ICD-10-CM

## 2022-06-10 DIAGNOSIS — E1159 Type 2 diabetes mellitus with other circulatory complications: Secondary | ICD-10-CM

## 2022-06-10 DIAGNOSIS — E1142 Type 2 diabetes mellitus with diabetic polyneuropathy: Secondary | ICD-10-CM | POA: Diagnosis not present

## 2022-06-10 LAB — BAYER DCA HB A1C WAIVED: HB A1C (BAYER DCA - WAIVED): 6.2 % — ABNORMAL HIGH (ref 4.8–5.6)

## 2022-06-10 NOTE — Progress Notes (Signed)
Sheena Graham is a 71 y.o. female presents to office today for annual physical exam examination.    Concerns today include: 1. Type 2 Diabetes with hypertension, hyperlipidemia:  She reports compliance with her Jentadueto extended release, glipizide extended release.  She denies any hypoglycemic episodes.  She has been doing fairly well and in fact has been a little bit more physically active because she has her 51-year-old great grandson Sheena Graham) and her grandson Sheena Graham) residing with her from [place].  He apparently has really taken a liking to her and is a very active kiddo.  Last eye exam: UTD Last foot exam: UTD Last A1c:  Lab Results  Component Value Date   HGBA1C 6.0 (H) 12/11/2021   Nephropathy screen indicated?: UTD Last flu, zoster and/or pneumovax:  Immunization History  Administered Date(s) Administered   Fluad Quad(high Dose 65+) 11/27/2017, 09/30/2018, 12/11/2021   Influenza, High Dose Seasonal PF 11/27/2017, 09/30/2018   Influenza, Seasonal, Injecte, Preservative Fre 09/24/2015   Influenza,inj,Quad PF,6+ Mos 09/11/2016   Influenza-Unspecified 09/24/2015, 11/27/2017, 09/30/2018   Moderna Sars-Covid-2 Vaccination 02/23/2020, 03/22/2020   Pneumococcal Conjugate-13 06/29/2018   Pneumococcal Polysaccharide-23 02/15/2016, 04/14/2017   Tdap 05/01/2014   Zoster Recombinat (Shingrix) 12/11/2021    ROS: Denies dizziness, LOC, polyuria, polydipsia, unintended weight loss/gain, foot ulcerations, numbness or tingling in extremities, shortness of breath or chest pain.  2. Vit D Deficiency Reports completion of prescription vitamin D and is currently taking OTC vitamin D as directed.  She does not report any bony pain and reports good energy and has no complaints today.  Last DEXA scan performed May 2019 and was normal.  3.  Aortic stenosis Patient had echocardiogram in 2020 which demonstrated mild to moderate aortic stenosis.  She does not report any shortness of breath,  change in exercise tolerance, orthopnea, presyncopal episodes or dizziness.  She occasionally has edema but this is when she stands on concrete for prolonged amounts of time cooking  Diet: Fair, typical American, Exercise: Plays with her grandson but no structured otherwise Last colonoscopy: n/a Last mammogram: needs Last pap smear: n/a Refills needed today: None Immunizations needed: Shingrix #2 Immunization History  Administered Date(s) Administered   Fluad Quad(high Dose 65+) 11/27/2017, 09/30/2018, 12/11/2021   Influenza, High Dose Seasonal PF 11/27/2017, 09/30/2018   Influenza, Seasonal, Injecte, Preservative Fre 09/24/2015   Influenza,inj,Quad PF,6+ Mos 09/11/2016   Influenza-Unspecified 09/24/2015, 11/27/2017, 09/30/2018   Moderna Sars-Covid-2 Vaccination 02/23/2020, 03/22/2020   Pneumococcal Conjugate-13 06/29/2018   Pneumococcal Polysaccharide-23 02/15/2016, 04/14/2017   Tdap 05/01/2014   Zoster Recombinat (Shingrix) 12/11/2021     Past Medical History:  Diagnosis Date   Arthritis    Diabetes mellitus without complication ([place])    Hypertension    Social History   Socioeconomic History   Marital status: Widowed    Spouse name: Not on file   Number of children: 2   Years of education: 9   Highest education level: 9th grade  Occupational History   Not on file  Tobacco Use   Smoking status: Former    Years: 15.00    Types: Cigarettes    Quit date: 06/24/1999    Years since quitting: 22.9   Smokeless tobacco: Never  Vaping Use   Vaping Use: Never used  Substance and Sexual Activity   Alcohol use: No   Drug use: No   Sexual activity: Not Currently  Other Topics Concern   Not on file  Social History Narrative   Disabled   Her son  and grandson live with her   Social Determinants of Health   Financial Resource Strain: Not on file  Food Insecurity: No Food Insecurity (04/04/2020)   Hunger Vital Sign    Worried About Running Out of Food in the Last Year:  Never true    Ran Out of Food in the Last Year: Never true  Transportation Needs: No Transportation Needs (04/04/2020)   PRAPARE - Administrator, Civil Service (Medical): No    Lack of Transportation (Non-Medical): No  Physical Activity: Inactive (04/04/2020)   Exercise Vital Sign    Days of Exercise per Week: 0 days    Minutes of Exercise per Session: 0 min  Stress: No Stress Concern Present (04/04/2020)   [employer] of Occupational Health - Occupational Stress Questionnaire    Feeling of Stress : Not at all  Social Connections: Moderately Isolated (04/04/2020)   Social Connection and Isolation Panel [NHANES]    Frequency of Communication with Friends and Family: More than three times a week    Frequency of Social Gatherings with Friends and Family: More than three times a week    Attends Religious Services: 1 to 4 times per year    Active Member of [employer] or Organizations: No    Attends Banker Meetings: Never    Marital Status: Widowed  Intimate Partner Violence: Not on file   History reviewed. No pertinent surgical history. Family History  Problem Relation Age of Onset   Hypertension Mother    Cancer Mother        colon   Hypertension Father    Hypertension Sister    Congestive Heart Failure Sister    Aneurysm Sister    Hypertension Brother    Heart attack Brother    Pulmonary disease Brother     Current Outpatient Medications:    acetaminophen (TYLENOL) 500 MG tablet, Take 500 mg by mouth every 6 (six) hours as needed for mild pain or moderate pain., Disp: , Rfl:    Alpha-Lipoic Acid 600 MG CAPS, Take 1 capsule (600 mg total) by mouth daily. For diabetic neuropathy in feet, Disp: 90 capsule, Rfl: 3   amLODipine (NORVASC) 5 MG tablet, Take 1 tablet (5 mg total) by mouth daily., Disp: 90 tablet, Rfl: 3   atorvastatin (LIPITOR) 20 MG tablet, Take 1 tablet (20 mg total) by mouth every evening., Disp: 90 tablet, Rfl: 3   colchicine 0.6 MG tablet,  Take 1 tablet (0.6 mg total) by mouth daily., Disp: 90 tablet, Rfl: 3   fluticasone (FLONASE) 50 MCG/ACT nasal spray, Place into the nose daily. , Disp: , Rfl:    gabapentin (NEURONTIN) 300 MG capsule, Take 1 capsule (300 mg total) by mouth 3 (three) times daily., Disp: 270 capsule, Rfl: 1   glipiZIDE (GLUCOTROL XL) 5 MG 24 hr tablet, Take 1 tablet (5 mg total) by mouth daily with breakfast., Disp: 90 tablet, Rfl: 3   glucose blood test strip, Test BS daily Dx E11.42 One touch verio, Disp: 100 each, Rfl: 3   linaGLIPtin-metFORMIN HCl ER (JENTADUETO XR) 03-999 MG TB24, Take 1 tablet by mouth daily with breakfast., Disp: 90 tablet, Rfl: 3   lisinopril-hydrochlorothiazide (ZESTORETIC) 20-25 MG tablet, Take 1 tablet by mouth daily., Disp: 90 tablet, Rfl: 3   loratadine (CLARITIN) 10 MG tablet, TAKE 1 TABLET DAILY, Disp: 30 tablet, Rfl: 11   metoprolol tartrate (LOPRESSOR) 50 MG tablet, Take 1 tablet 2 (two) times daily., Disp: 180 tablet, Rfl: 3  omeprazole (PRILOSEC) 20 MG capsule, Take 1 capsule (20 mg total) by mouth daily., Disp: 90 capsule, Rfl: 3  No Known Allergies   ROS: Review of Systems Pertinent items noted in HPI and remainder of comprehensive ROS otherwise negative.    Physical exam BP (!) 148/78   Pulse 61   Temp (!) 97.2 F (36.2 C)   Ht 5\' 3"  (1.6 m)   Wt 219 lb 12.8 oz (99.7 kg)   SpO2 94%   BMI 38.94 kg/m  General appearance: alert, cooperative, appears stated age, no distress, and morbidly obese Head: Normocephalic, without obvious abnormality, atraumatic Eyes: negative findings: lids and lashes normal, conjunctivae and sclerae normal, corneas clear, and pupils equal, round, reactive to light and accomodation Ears: normal TM's and external ear canals both ears Nose: Nares normal. Septum midline. Mucosa normal. No drainage or sinus tenderness. Throat:  Teeth absent.  Oropharynx without masses or erythema.  No ulcerations.  Moist mucous membranes present Neck: no  adenopathy, supple, symmetrical, trachea midline, thyroid not enlarged, symmetric, no tenderness/mass/nodules, and systolic murmur radiates to carotids Back: symmetric, no curvature. ROM normal. No CVA tenderness. Lungs: clear to auscultation bilaterally Heart:  Regular rate and rhythm with 2 out of 6 systolic ejection murmur which radiates to carotids Abdomen: soft, non-tender; bowel sounds normal; no masses,  no organomegaly and obese Extremities: extremities normal, atraumatic, no cyanosis or edema Pulses: 2+ and symmetric Skin: Skin color, texture, turgor normal. No rashes or lesions Lymph nodes: Cervical, supraclavicular, and axillary nodes normal. Neurologic: Grossly normal Psych: Mood stable, speech normal, affect appropriate     12/11/2021   10:14 AM 06/10/2021    9:00 AM 12/07/2020   11:13 AM  Depression screen PHQ 2/9  Decreased Interest 0 0 0  Down, Depressed, Hopeless 0 0 0  PHQ - 2 Score 0 0 0  Altered sleeping   0  Tired, decreased energy   0  Change in appetite   0  Feeling bad or failure about yourself    0  Trouble concentrating   0  Moving slowly or fidgety/restless   0  Suicidal thoughts   0  PHQ-9 Score   0      12/11/2021   10:14 AM 06/10/2021    9:00 AM  GAD 7 : Generalized Anxiety Score  Nervous, Anxious, on Edge 0 0  Control/stop worrying 0 0  Worry too much - different things 0 0  Trouble relaxing 0 0  Restless 0 0  Easily annoyed or irritable 0 0  Afraid - awful might happen 0 0  Total GAD 7 Score 0 0  Anxiety Difficulty Not difficult at all Not difficult at all    Assessment/ Plan: Doloras E Pinkney here for annual physical exam.   DM type 2 with diabetic dyslipidemia ([place]) - Plan: [facility] [place] Hb A1c Waived  Annual physical exam  Need for shingles vaccine - Plan: Varicella-zoster vaccine IM (Shingrix)  Hypertension associated with diabetes ([place])  Diabetic polyneuropathy associated with type 2 diabetes mellitus ([place])  Mild aortic stenosis by  prior echocardiogram  Vitamin D deficiency - Plan: VITAMIN D 25 Hydroxy (Vit-D Deficiency, Fractures), DG [facility] DEXA  Asymptomatic postmenopausal estrogen deficiency - Plan: DG [facility] DEXA  Up-to-date on preventive healthcare now that second shingles vaccination administered.  A1c collected and shows continued control of blood sugar.  Blood pressure controlled for age.  No changes  No refills needed on gabapentin.  Mild to moderate aortic stenosis by echo in 2020.  She has 2 out of 6 systolic ejection murmur appreciated that radiates to carotids.  She is totally asymptomatic so no further work-up planned at this time  Check vitamin D level given completion of prescription vitamin D and continued OTC vitamin D  DEXA scan ordered as last DEXA scan was performed in 2019.  Counseled on healthy lifestyle choices, including diet (rich in fruits, vegetables and lean meats and low in salt and simple carbohydrates) and exercise (at least 30 minutes of moderate physical activity daily).  Patient to follow up in 39m  Ayomikun Starling M. Nadine Counts, DO

## 2022-06-11 LAB — VITAMIN D 25 HYDROXY (VIT D DEFICIENCY, FRACTURES): Vit D, 25-Hydroxy: 42.7 ng/mL (ref 30.0–100.0)

## 2022-06-13 ENCOUNTER — Other Ambulatory Visit: Payer: Self-pay | Admitting: Family Medicine

## 2022-06-13 DIAGNOSIS — Z1231 Encounter for screening mammogram for malignant neoplasm of breast: Secondary | ICD-10-CM

## 2022-06-16 ENCOUNTER — Ambulatory Visit
Admission: RE | Admit: 2022-06-16 | Discharge: 2022-06-16 | Disposition: A | Discharge status: Home / Self Care | Payer: Medicare Other | Source: Ambulatory Visit | Attending: Family Medicine | Admitting: Family Medicine

## 2022-06-16 ENCOUNTER — Ambulatory Visit (INDEPENDENT_AMBULATORY_CARE_PROVIDER_SITE_OTHER): Payer: Medicare Other

## 2022-06-16 VITALS — Wt 219.0 lb

## 2022-06-16 DIAGNOSIS — Z0000 Encounter for general adult medical examination without abnormal findings: Secondary | ICD-10-CM | POA: Diagnosis not present

## 2022-06-16 DIAGNOSIS — Z1231 Encounter for screening mammogram for malignant neoplasm of breast: Secondary | ICD-10-CM

## 2022-06-16 NOTE — Progress Notes (Signed)
Subjective:   Sheena Graham is a 71 y.o. female who presents for Medicare Annual (Subsequent) preventive examination.  Virtual Visit via Telephone Note  I connected with  Sheena Graham on 06/16/22 at  8:15 AM EDT by telephone and verified that I am speaking with the correct person using two identifiers.  Location: Patient: Home Provider: [facility] Persons participating in the virtual visit: patient/Nurse Health Advisor   I discussed the limitations, risks, security and privacy concerns of performing an evaluation and management service by telephone and the availability of in person appointments. The patient expressed understanding and agreed to proceed.  Interactive audio and video telecommunications were attempted between this nurse and patient, however failed, due to patient having technical difficulties OR patient did not have access to video capability.  We continued and completed visit with audio only.  Some vital signs may be absent or patient reported.   Sheena Sardo E Merrel Crabbe, LPN   Review of Systems     Cardiac Risk Factors include: advanced age (>37men, >45 women);diabetes mellitus;dyslipidemia;hypertension;obesity (BMI >30kg/m2);smoking/ tobacco exposure;Other (see comment), Risk factor comments: CAD, aortic stenosis     Objective:    Today's Vitals   06/16/22 0815  Weight: 219 lb (99.3 kg)   Body mass index is 38.79 kg/m.     06/16/2022    8:21 AM 04/04/2020    8:52 AM 03/09/2016    2:09 PM 12/31/2015   11:24 AM  Advanced Directives  Does Patient Have a Medical Advance Directive? No No No No  Would patient like information on creating a medical advance directive? No - Patient declined No - Patient declined  No - patient declined information    Current Medications (verified) Outpatient Encounter Medications as of 06/16/2022  Medication Sig   acetaminophen (TYLENOL) 500 MG tablet Take 500 mg by mouth every 6 (six) hours as needed for mild pain or moderate pain.    Alpha-Lipoic Acid 600 MG CAPS Take 1 capsule (600 mg total) by mouth daily. For diabetic neuropathy in feet   amLODipine (NORVASC) 5 MG tablet Take 1 tablet (5 mg total) by mouth daily.   atorvastatin (LIPITOR) 20 MG tablet Take 1 tablet (20 mg total) by mouth every evening.   colchicine 0.6 MG tablet Take 1 tablet (0.6 mg total) by mouth daily.   fluticasone (FLONASE) 50 MCG/ACT nasal spray Place into the nose daily.    gabapentin (NEURONTIN) 300 MG capsule Take 1 capsule (300 mg total) by mouth 3 (three) times daily.   glipiZIDE (GLUCOTROL XL) 5 MG 24 hr tablet Take 1 tablet (5 mg total) by mouth daily with breakfast.   linaGLIPtin-metFORMIN HCl ER (JENTADUETO XR) 03-999 MG TB24 Take 1 tablet by mouth daily with breakfast.   lisinopril-hydrochlorothiazide (ZESTORETIC) 20-25 MG tablet Take 1 tablet by mouth daily.   loratadine (CLARITIN) 10 MG tablet TAKE 1 TABLET DAILY   metoprolol tartrate (LOPRESSOR) 50 MG tablet Take 1 tablet 2 (two) times daily.   omeprazole (PRILOSEC) 20 MG capsule Take 1 capsule (20 mg total) by mouth daily.   glucose blood test strip Test BS daily Dx E11.42 One touch verio (Patient not taking: Reported on 06/16/2022)   No facility-administered encounter medications on file as of 06/16/2022.    Allergies (verified) Patient has no known allergies.   History: Past Medical History:  Diagnosis Date   Arthritis    Diabetes mellitus without complication (HCC)    Hypertension    History reviewed. No pertinent surgical history. Family History  Problem Relation Age of Onset   Hypertension Mother    Cancer Mother        colon   Hypertension Father    Hypertension Sister    Congestive Heart Failure Sister    Aneurysm Sister    Hypertension Brother    Heart attack Brother    Pulmonary disease Brother    Social History   Socioeconomic History   Marital status: Widowed    Spouse name: Not on file   Number of children: 2   Years of education: 9   Highest  education level: 9th grade  Occupational History   Occupation: retired  Tobacco Use   Smoking status: Former    Years: 15.00    Types: Cigarettes    Quit date: 06/24/1999    Years since quitting: 22.9   Smokeless tobacco: Never  Vaping Use   Vaping Use: Never used  Substance and Sexual Activity   Alcohol use: No   Drug use: No   Sexual activity: Not Currently  Other Topics Concern   Not on file  Social History Narrative   Disabled   Her son and grandson live with her   Social Determinants of Health   Financial Resource Strain: Low Risk  (06/16/2022)   Overall Financial Resource Strain (CARDIA)    Difficulty of Paying Living Expenses: Not hard at all  Food Insecurity: No Food Insecurity (06/16/2022)   Hunger Vital Sign    Worried About Running Out of Food in the Last Year: Never true    Ran Out of Food in the Last Year: Never true  Transportation Needs: No Transportation Needs (06/16/2022)   PRAPARE - Administrator, Civil Service (Medical): No    Lack of Transportation (Non-Medical): No  Physical Activity: Sufficiently Active (06/16/2022)   Exercise Vital Sign    Days of Exercise per Week: 7 days    Minutes of Exercise per Session: 30 min  Stress: No Stress Concern Present (06/16/2022)   [employer] of Occupational Health - Occupational Stress Questionnaire    Feeling of Stress : Not at all  Social Connections: Moderately Integrated (06/16/2022)   Social Connection and Isolation Panel [NHANES]    Frequency of Communication with Friends and Family: More than three times a week    Frequency of Social Gatherings with Friends and Family: More than three times a week    Attends Religious Services: More than 4 times per year    Active Member of [employer] or Organizations: Yes    Attends Banker Meetings: More than 4 times per year    Marital Status: Widowed    Tobacco Counseling Counseling given: Not Answered   Clinical Intake:  Pre-visit  preparation completed: Yes  Pain : No/denies pain     BMI - recorded: 38.79 Nutritional Status: BMI > 30  Obese Nutritional Risks: None Diabetes: Yes CBG done?: No Did pt. bring in CBG monitor from home?: No  How often do you need to have someone help you when you read instructions, pamphlets, or other written materials from your doctor or pharmacy?: 1 - Never  Diabetic? Nutrition Risk Assessment:  Has the patient had any N/V/D within the last 2 months?  No  Does the patient have any non-healing wounds?  No  Has the patient had any unintentional weight loss or weight gain?  No   Diabetes:  Is the patient diabetic?  Yes  If diabetic, was a CBG obtained today?  No  Did the  patient bring in their glucometer from home?  No  How often do you monitor your CBG's? never.   Financial Strains and Diabetes Management:  Are you having any financial strains with the device, your supplies or your medication? No .  Does the patient want to be seen by Chronic Care Management for management of their diabetes?  No  Would the patient like to be referred to a Nutritionist or for Diabetic Management?  No   Diabetic Exams:  Diabetic Eye Exam: Completed 10/16/2021  Diabetic Foot Exam: Completed 12/11/2021. Pt has been advised about the importance in completing this exam.   Interpreter Needed?: No  Information entered by :: Cena Bruhn, LPN   Activities of Daily Living    06/16/2022    8:19 AM  In your present state of health, do you have any difficulty performing the following activities:  Hearing? 0  Vision? 0  Difficulty concentrating or making decisions? 0  Walking or climbing stairs? 0  Dressing or bathing? 0  Doing errands, shopping? 0  Preparing Food and eating ? N  Using the Toilet? N  In the past six months, have you accidently leaked urine? Y  Do you have problems with loss of bowel control? Y  Comment urgency - sometimes can't make it in time - especially if she waits   Managing your Medications? N  Managing your Finances? N  Housekeeping or managing your Housekeeping? N    Patient Care Team: Raliegh Ip, DO as PCP - General (Family Medicine)  Indicate any recent Medical Services you may have received from other than [facility] providers in the past year (date may be approximate).     Assessment:   This is a routine wellness examination for Caress.  Hearing/Vision screen Hearing Screening - Comments:: Denies hearing difficulties   Vision Screening - Comments:: Wears rx glasses - up to date with routine eye exams with [facility]  Dietary issues and exercise activities discussed: Current Exercise Habits: Home exercise routine, Type of exercise: walking;Other - see comments (plays with grandson outdoors a lot), Time (Minutes): 30, Frequency (Times/Week): 7, Weekly Exercise (Minutes/Week): 210, Intensity: Mild, Exercise limited by: None identified   Goals Addressed             This Visit's Progress    Increase physical activity   On track    Do more outside activities per patient Go to church more and get closer to the Lord       Depression Screen    06/16/2022    8:18 AM 12/11/2021   10:14 AM 06/10/2021    9:00 AM 12/07/2020   11:13 AM 07/31/2020    8:39 AM 04/04/2020    8:42 AM 03/27/2020    8:34 AM  PHQ 2/9 Scores  PHQ - 2 Score 0 0 0 0 0 0 0  PHQ- 9 Score    0 0  0    Fall Risk    06/16/2022    8:16 AM 12/11/2021   10:14 AM 06/10/2021    9:00 AM 12/07/2020   11:12 AM 07/31/2020    8:39 AM  Fall Risk   Falls in the past year? 0 0 0 0 0  Number falls in past yr: 0      Injury with Fall? 0      Risk for fall due to : No Fall Risks      Follow up Falls prevention discussed        FALL RISK PREVENTION  PERTAINING TO THE HOME:  Any stairs in or around the home? Yes  If so, are there any without handrails? No  Home free of loose throw rugs in walkways, pet beds, electrical cords, etc? Yes  Adequate lighting in your home to  reduce risk of falls? Yes   ASSISTIVE DEVICES UTILIZED TO PREVENT FALLS:  Life alert? No  Use of a cane, walker or w/c?  Cane prn Grab bars in the bathroom? No  Shower chair or bench in shower? No  Elevated toilet seat or a handicapped toilet? No   TIMED UP AND GO:  Was the test performed? No . Telephonic visit  Cognitive Function:        06/16/2022    8:21 AM 04/04/2020    8:45 AM  6CIT Screen  What Year? 0 points 0 points  What month? 0 points 0 points  What time? 0 points 0 points  Count back from 20 0 points 0 points  Months in reverse 4 points 4 points  Repeat phrase 4 points 6 points  Total Score 8 points 10 points    Immunizations Immunization History  Administered Date(s) Administered   Fluad Quad(high Dose 65+) 11/27/2017, 09/30/2018, 12/11/2021   Influenza, High Dose Seasonal PF 11/27/2017, 09/30/2018   Influenza, Seasonal, Injecte, Preservative Fre 09/24/2015   Influenza,inj,Quad PF,6+ Mos 09/11/2016   Influenza-Unspecified 09/24/2015, 11/27/2017, 09/30/2018   Moderna Sars-Covid-2 Vaccination 02/23/2020, 03/22/2020   Pneumococcal Conjugate-13 06/29/2018   Pneumococcal Polysaccharide-23 02/15/2016, 04/14/2017   Tdap 05/01/2014   Zoster Recombinat (Shingrix) 12/11/2021, 06/10/2022    TDAP status: Up to date  Flu Vaccine status: Up to date  Pneumococcal vaccine status: Up to date  Covid-19 vaccine status: Information provided on how to obtain vaccines.   Qualifies for Shingles Vaccine? Yes   Zostavax completed Yes   Shingrix Completed?: Yes  Screening Tests Health Maintenance  Topic Date Due   COVID-19 Vaccine (3 - Moderna series) 06/25/2022 (Originally 05/17/2020)   MAMMOGRAM  12/11/2022 (Originally 11/21/2021)   INFLUENZA VACCINE  06/24/2022   OPHTHALMOLOGY EXAM  10/16/2022   FOOT EXAM  12/11/2022   HEMOGLOBIN A1C  12/11/2022   Fecal DNA (Cologuard)  05/08/2023   TETANUS/TDAP  05/01/2024   DEXA SCAN  06/13/2027   Pneumonia Vaccine 65+ Years  old  Completed   Hepatitis C Screening  Completed   Zoster Vaccines- Shingrix  Completed   HPV VACCINES  Aged Out    Health Maintenance  There are no preventive care reminders to display for this patient.  Colorectal cancer screening: Type of screening: Cologuard. Completed 05/07/2020. Repeat every 3 years  Mammogram status: Ordered 05/2022. Pt provided with contact info and advised to call to schedule appt.  Has appt today  Bone Density status: Completed 06/12/2022. Results reflect: Bone density results: NORMAL. Repeat every 5 years.  Lung Cancer Screening: (Low Dose CT Chest recommended if Age 16-80 years, 30 pack-year currently smoking OR have quit w/in 15years.) does not qualify.   Additional Screening:  Hepatitis C Screening: does qualify; Completed 06/29/2018  Vision Screening: Recommended annual ophthalmology exams for early detection of glaucoma and other disorders of the eye. Is the patient up to date with their annual eye exam?  Yes  Who is the provider or what is the name of the office in which the patient attends annual eye exams? [facility] If pt is not established with a provider, would they like to be referred to a provider to establish care? No .   Dental Screening:  Recommended annual dental exams for proper oral hygiene  Community Resource Referral / Chronic Care Management: CRR required this visit?  No   CCM required this visit?  No      Plan:     I have personally reviewed and noted the following in the patient's chart:   Medical and social history Use of alcohol, tobacco or illicit drugs  Current medications and supplements including opioid prescriptions.  Functional ability and status Nutritional status Physical activity Advanced directives List of other physicians Hospitalizations, surgeries, and ER visits in previous 12 months Vitals Screenings to include cognitive, depression, and falls Referrals and appointments  In addition, I have  reviewed and discussed with patient certain preventive protocols, quality metrics, and best practice recommendations. A written personalized care plan for preventive services as well as general preventive health recommendations were provided to patient.     Sandrea Hammond, LPN   [id number]   Nurse Notes: None

## 2022-06-16 NOTE — Patient Instructions (Signed)
Ms. Sheena Graham , Thank you for taking time to come for your Medicare Wellness Visit. I appreciate your ongoing commitment to your health goals. Please review the following plan we discussed and let me know if I can assist you in the future.   Screening recommendations/referrals: Colonoscopy: Cologuard done 05/07/2020 - repeat in 3 years Mammogram: Done 11/21/2020 - Repeat annually *appt today @ 2:50 Bone Density: Done 06/12/2022 - Repeat in 5 years  Recommended yearly ophthalmology/optometry visit for glaucoma screening and checkup Recommended yearly dental visit for hygiene and checkup  Vaccinations: Influenza vaccine: Done 12/11/2021 - repeat in fall Pneumococcal vaccine: Done 02/15/2016, 04/14/2017, & 06/29/2018 Tdap vaccine: Done 05/01/2014 - Repeat in 10 years  Shingles vaccine: Done  12/11/2021 & 06/10/2022 Covid-19:Done 02/23/2020 & 03/22/2020 - for boosters, contact pharmacy  Advanced directives: Advance directive discussed with you today. Even though you declined this today, please call our office should you change your mind, and we can give you the proper paperwork for you to fill out.   Conditions/risks identified: Keep up the great work! Aim for 30 minutes of exercise or brisk walking, 6-8 glasses of water, and 5 servings of fruits and vegetables each day.   Next appointment: Follow up in one year for your annual wellness visit    Preventive Care 65 Years and Older, Female Preventive care refers to lifestyle choices and visits with your health care provider that can promote health and wellness. What does preventive care include? A yearly physical exam. This is also called an annual well check. Dental exams once or twice a year. Routine eye exams. Ask your health care provider how often you should have your eyes checked. Personal lifestyle choices, including: Daily care of your teeth and gums. Regular physical activity. Eating a healthy diet. Avoiding tobacco and drug use. Limiting alcohol  use. Practicing safe sex. Taking low-dose aspirin every day. Taking vitamin and mineral supplements as recommended by your health care provider. What happens during an annual well check? The services and screenings done by your health care provider during your annual well check will depend on your age, overall health, lifestyle risk factors, and family history of disease. Counseling  Your health care provider may ask you questions about your: Alcohol use. Tobacco use. Drug use. Emotional well-being. Home and relationship well-being. Sexual activity. Eating habits. History of falls. Memory and ability to understand (cognition). Work and work Astronomer. Reproductive health. Screening  You may have the following tests or measurements: Height, weight, and BMI. Blood pressure. Lipid and cholesterol levels. These may be checked every 5 years, or more frequently if you are over 76 years old. Skin check. Lung cancer screening. You may have this screening every year starting at age 79 if you have a 30-pack-year history of smoking and currently smoke or have quit within the past 15 years. Fecal occult blood test (FOBT) of the stool. You may have this test every year starting at age 45. Flexible sigmoidoscopy or colonoscopy. You may have a sigmoidoscopy every 5 years or a colonoscopy every 10 years starting at age 14. Hepatitis C blood test. Hepatitis B blood test. Sexually transmitted disease (STD) testing. Diabetes screening. This is done by checking your blood sugar (glucose) after you have not eaten for a while (fasting). You may have this done every 1-3 years. Bone density scan. This is done to screen for osteoporosis. You may have this done starting at age 18. Mammogram. This may be done every 1-2 years. Talk to your health care  provider about how often you should have regular mammograms. Talk with your health care provider about your test results, treatment options, and if necessary,  the need for more tests. Vaccines  Your health care provider may recommend certain vaccines, such as: Influenza vaccine. This is recommended every year. Tetanus, diphtheria, and acellular pertussis (Tdap, Td) vaccine. You may need a Td booster every 10 years. Zoster vaccine. You may need this after age 4. Pneumococcal 13-valent conjugate (PCV13) vaccine. One dose is recommended after age 45. Pneumococcal polysaccharide (PPSV23) vaccine. One dose is recommended after age 86. Talk to your health care provider about which screenings and vaccines you need and how often you need them. This information is not intended to replace advice given to you by your health care provider. Make sure you discuss any questions you have with your health care provider. Document Released: 12/07/2015 Document Revised: 07/30/2016 Document Reviewed: 09/11/2015 Elsevier Interactive Patient Education  2017 [place] Prevention in the Home Falls can cause injuries. They can happen to people of all ages. There are many things you can do to make your home safe and to help prevent falls. What can I do on the outside of my home? Regularly fix the edges of walkways and driveways and fix any cracks. Remove anything that might make you trip as you walk through a door, such as a raised step or threshold. Trim any bushes or trees on the path to your home. Use bright outdoor lighting. Clear any walking paths of anything that might make someone trip, such as rocks or tools. Regularly check to see if handrails are loose or broken. Make sure that both sides of any steps have handrails. Any raised decks and porches should have guardrails on the edges. Have any leaves, snow, or ice cleared regularly. Use sand or salt on walking paths during winter. Clean up any spills in your garage right away. This includes oil or grease spills. What can I do in the bathroom? Use night lights. Install grab bars by the toilet and in the  tub and shower. Do not use towel bars as grab bars. Use non-skid mats or decals in the tub or shower. If you need to sit down in the shower, use a plastic, non-slip stool. Keep the floor dry. Clean up any water that spills on the floor as soon as it happens. Remove soap buildup in the tub or shower regularly. Attach bath mats securely with double-sided non-slip rug tape. Do not have throw rugs and other things on the floor that can make you trip. What can I do in the bedroom? Use night lights. Make sure that you have a light by your bed that is easy to reach. Do not use any sheets or blankets that are too big for your bed. They should not hang down onto the floor. Have a firm chair that has side arms. You can use this for support while you get dressed. Do not have throw rugs and other things on the floor that can make you trip. What can I do in the kitchen? Clean up any spills right away. Avoid walking on wet floors. Keep items that you use a lot in easy-to-reach places. If you need to reach something above you, use a strong step stool that has a grab bar. Keep electrical cords out of the way. Do not use floor polish or wax that makes floors slippery. If you must use wax, use non-skid floor wax. Do not have throw rugs  and other things on the floor that can make you trip. What can I do with my stairs? Do not leave any items on the stairs. Make sure that there are handrails on both sides of the stairs and use them. Fix handrails that are broken or loose. Make sure that handrails are as long as the stairways. Check any carpeting to make sure that it is firmly attached to the stairs. Fix any carpet that is loose or worn. Avoid having throw rugs at the top or bottom of the stairs. If you do have throw rugs, attach them to the floor with carpet tape. Make sure that you have a light switch at the top of the stairs and the bottom of the stairs. If you do not have them, ask someone to add them for  you. What else can I do to help prevent falls? Wear shoes that: Do not have high heels. Have rubber bottoms. Are comfortable and fit you well. Are closed at the toe. Do not wear sandals. If you use a stepladder: Make sure that it is fully opened. Do not climb a closed stepladder. Make sure that both sides of the stepladder are locked into place. Ask someone to hold it for you, if possible. Clearly mark and make sure that you can see: Any grab bars or handrails. First and last steps. Where the edge of each step is. Use tools that help you move around (mobility aids) if they are needed. These include: Canes. Walkers. Scooters. Crutches. Turn on the lights when you go into a dark area. Replace any light bulbs as soon as they burn out. Set up your furniture so you have a clear path. Avoid moving your furniture around. If any of your floors are uneven, fix them. If there are any pets around you, be aware of where they are. Review your medicines with your doctor. Some medicines can make you feel dizzy. This can increase your chance of falling. Ask your doctor what other things that you can do to help prevent falls. This information is not intended to replace advice given to you by your health care provider. Make sure you discuss any questions you have with your health care provider. Document Released: 09/06/2009 Document Revised: 04/17/2016 Document Reviewed: 12/15/2014 Elsevier Interactive Patient Education  2017 [employer].

## 2022-07-01 ENCOUNTER — Other Ambulatory Visit: Payer: Self-pay | Admitting: Family Medicine

## 2022-07-01 DIAGNOSIS — E1142 Type 2 diabetes mellitus with diabetic polyneuropathy: Secondary | ICD-10-CM

## 2022-07-31 ENCOUNTER — Other Ambulatory Visit: Payer: Self-pay | Admitting: Family Medicine

## 2022-07-31 DIAGNOSIS — E1142 Type 2 diabetes mellitus with diabetic polyneuropathy: Secondary | ICD-10-CM

## 2022-08-11 ENCOUNTER — Encounter: Payer: Self-pay | Admitting: Nurse Practitioner

## 2022-08-11 ENCOUNTER — Ambulatory Visit (INDEPENDENT_AMBULATORY_CARE_PROVIDER_SITE_OTHER): Payer: Medicare Other | Admitting: Nurse Practitioner

## 2022-08-11 VITALS — BP 147/75 | HR 65 | Temp 97.3°F | Resp 20 | Ht 63.0 in | Wt 212.0 lb

## 2022-08-11 DIAGNOSIS — H9202 Otalgia, left ear: Secondary | ICD-10-CM

## 2022-08-11 MED ORDER — PREDNISONE 20 MG PO TABS: 40.0000 mg | ORAL_TABLET | Freq: Every day | ORAL | 0 refills | Status: AC

## 2022-08-11 NOTE — Patient Instructions (Signed)
Earache, Adult ?An earache, or ear pain, can be caused by many things, including: ?An infection. ?Ear wax buildup. ?Ear pressure. ?Something in the ear that should not be there (foreign body). ?A sore throat. ?Tooth problems. ?Jaw problems. ?Treatment of the earache will depend on the cause. If the cause is not clear or cannot be determined, you may need to watch your symptoms until your earache goes away or until a cause is found. ?Follow these instructions at home: ?Medicines ?Take or apply over-the-counter and prescription medicines only as told by your health care provider. ?If you were prescribed an antibiotic medicine, use it as told by your health care provider. Do not stop using the antibiotic even if you start to feel better. ?Do not put anything in your ear other than medicine that is prescribed by your health care provider. ?Managing pain ?If directed, apply heat to the affected area as often as told by your health care provider. Use the heat source that your health care provider recommends, such as a moist heat pack or a heating pad. ?Place a towel between your skin and the heat source. ?Leave the heat on for 20-30 minutes. ?Remove the heat if your skin turns bright red. This is especially important if you are unable to feel pain, heat, or cold. You may have a greater risk of getting burned. ?If directed, put ice on the affected area as often as told by your health care provider. To do this: ? ?  ? ?Put ice in a plastic bag. ?Place a towel between your skin and the bag. ?Leave the ice on for 20 minutes, 2-3 times a day. ?General instructions ?Pay attention to any changes in your symptoms. ?Try resting in an upright position instead of lying down. This may help to reduce pressure in your ear and relieve pain. ?Chew gum if it helps to relieve your ear pain. ?Treat any allergies as told by your health care provider. ?Drink enough fluid to keep your urine pale yellow. ?It is up to you to get the results of  any tests that were done. Ask your health care provider, or the department that is doing the tests, when your results will be ready. ?Keep all follow-up visits as told by your health care provider. This is important. ?Contact a health care provider if: ?Your pain does not improve within 2 days. ?Your earache gets worse. ?You have new symptoms. ?You have a fever. ?Get help right away if you: ?Have a severe headache. ?Have a stiff neck. ?Have trouble swallowing. ?Have redness or swelling behind your ear. ?Have fluid or blood coming from your ear. ?Have hearing loss. ?Feel dizzy. ?Summary ?An earache, or ear pain, can be caused by many things. ?Treatment of the earache will depend on the cause. Follow recommendations from your health care provider to treat your ear pain. ?If the cause is not clear or cannot be determined, you may need to watch your symptoms until your earache goes away or until a cause is found. ?Keep all follow-up visits as told by your health care provider. This is important. ?This information is not intended to replace advice given to you by your health care provider. Make sure you discuss any questions you have with your health care provider. ?Document Revised: 06/17/2019 Document Reviewed: 06/18/2019 ?Elsevier Patient Education ? 2023 Elsevier Inc[employer] ? ?

## 2022-08-11 NOTE — Progress Notes (Signed)
   Subjective:    Patient ID: Sheena Graham, female    DOB: [birth year], 71 y.o.   MRN: [id number]   Chief Complaint: Ear Pain   HPI Patient come sin c/o left ear pain that radiates down her jaw. Started last week and has gotten no better. She has not taken anything for it.   Review of Systems  Constitutional:  Negative for diaphoresis.  HENT:  Positive for ear pain (left). Negative for congestion, ear discharge and rhinorrhea.   Eyes:  Negative for pain.  Respiratory:  Positive for cough (slight). Negative for shortness of breath.   Cardiovascular:  Negative for chest pain, palpitations and leg swelling.  Gastrointestinal:  Negative for abdominal pain.  Endocrine: Negative for polydipsia.  Skin:  Negative for rash.  Neurological:  Negative for dizziness, weakness and headaches.  Hematological:  Does not bruise/bleed easily.  All other systems reviewed and are negative.      Objective:   Physical Exam Vitals reviewed.  Constitutional:      Appearance: Normal appearance.  HENT:     Right Ear: Tympanic membrane normal.     Left Ear: Tympanic membrane normal.     Ears:     Comments: Pain anterior to left ear on palpation    Nose: No congestion or rhinorrhea.     Mouth/Throat:     Mouth: Mucous membranes are moist.  Eyes:     Extraocular Movements: Extraocular movements intact.     Pupils: Pupils are equal, round, and reactive to light.  Cardiovascular:     Rate and Rhythm: Normal rate and regular rhythm.     Heart sounds: Normal heart sounds.  Pulmonary:     Effort: Pulmonary effort is normal.     Breath sounds: Normal breath sounds.  Skin:    General: Skin is warm.  Neurological:     General: No focal deficit present.     Mental Status: She is alert and oriented to person, place, and time.  Psychiatric:        Mood and Affect: Mood normal.        Behavior: Behavior normal.    BP (!) 147/75   Pulse 65   Temp (!) 97.3 F (36.3 C) (Temporal)   Resp 20   Ht 5'  3" (1.6 m)   Wt 212 lb (96.2 kg)   SpO2 98%   BMI 37.55 kg/m         Assessment & Plan:  Avanti E Dominski in today with chief complaint of Ear Pain   1. Left ear pain Moist heat to area Do not stick anything in ear RTO prn  Meds ordered this encounter  Medications   predniSONE (DELTASONE) 20 MG tablet    Sig: Take 2 tablets (40 mg total) by mouth daily with breakfast for 5 days. 2 po daily for 5 days    Dispense:  10 tablet    Refill:  0    Order Specific Question:   Supervising Provider    Answer:   Caryl Pina A [[id number]]       The above assessment and management plan was discussed with the patient. The patient verbalized understanding of and has agreed to the management plan. Patient is aware to call the clinic if symptoms persist or worsen. Patient is aware when to return to the clinic for a follow-up visit. Patient educated on when it is appropriate to go to the emergency department.   Mary-Margaret Hassell Done, FNP

## 2022-09-12 LAB — HM DIABETES EYE EXAM

## 2022-12-12 ENCOUNTER — Encounter: Payer: Self-pay | Admitting: Family Medicine

## 2022-12-12 ENCOUNTER — Ambulatory Visit (INDEPENDENT_AMBULATORY_CARE_PROVIDER_SITE_OTHER): Payer: Medicare Other | Admitting: Family Medicine

## 2022-12-12 VITALS — BP 139/70 | HR 74 | Temp 98.6°F | Ht 63.0 in | Wt 204.0 lb

## 2022-12-12 DIAGNOSIS — E1169 Type 2 diabetes mellitus with other specified complication: Secondary | ICD-10-CM

## 2022-12-12 DIAGNOSIS — Z23 Encounter for immunization: Secondary | ICD-10-CM

## 2022-12-12 DIAGNOSIS — E1159 Type 2 diabetes mellitus with other circulatory complications: Secondary | ICD-10-CM

## 2022-12-12 DIAGNOSIS — E1142 Type 2 diabetes mellitus with diabetic polyneuropathy: Secondary | ICD-10-CM | POA: Diagnosis not present

## 2022-12-12 DIAGNOSIS — E785 Hyperlipidemia, unspecified: Secondary | ICD-10-CM

## 2022-12-12 DIAGNOSIS — I152 Hypertension secondary to endocrine disorders: Secondary | ICD-10-CM

## 2022-12-12 LAB — BAYER DCA HB A1C WAIVED: HB A1C (BAYER DCA - WAIVED): 6.1 % — ABNORMAL HIGH (ref 4.8–5.6)

## 2022-12-12 MED ORDER — GABAPENTIN 300 MG PO CAPS: ORAL_CAPSULE | ORAL | 3 refills | Status: DC

## 2022-12-12 MED ORDER — AMLODIPINE BESYLATE 5 MG PO TABS: 5.0000 mg | ORAL_TABLET | Freq: Every day | ORAL | 3 refills | Status: DC

## 2022-12-12 MED ORDER — OMEPRAZOLE 20 MG PO CPDR: 20.0000 mg | DELAYED_RELEASE_CAPSULE | Freq: Every day | ORAL | 3 refills | Status: DC

## 2022-12-12 MED ORDER — ATORVASTATIN CALCIUM 20 MG PO TABS: 20.0000 mg | ORAL_TABLET | Freq: Every evening | ORAL | 3 refills | Status: DC

## 2022-12-12 NOTE — Progress Notes (Signed)
Subjective: CC:DM PCP: Janora Norlander, DO IRW:[id number] E Meigs is a 72 y.o. female presenting to clinic today for:  1. Type 2 Diabetes with hypertension, hyperlipidemia w/ neuropathy:  Does not routinely check blood sugar at home.  Compliant with glipizide, Jentadueto, Lipitor, Norvasc, Zestoretic and Lopressor.  Last eye exam: done 09/2022, ROI completed Last foot exam: needs Last A1c:  Lab Results  Component Value Date   HGBA1C 6.2 (H) 06/10/2022   Nephropathy screen indicated?: needs Last flu, zoster and/or pneumovax:  Immunization History  Administered Date(s) Administered   Fluad Quad(high Dose 65+) 11/27/2017, 09/30/2018, 12/11/2021, 12/12/2022   Influenza, High Dose Seasonal PF 11/27/2017, 09/30/2018   Influenza, Seasonal, Injecte, Preservative Fre 09/24/2015   Influenza,inj,Quad PF,6+ Mos 09/11/2016   Influenza-Unspecified 09/24/2015, 11/27/2017, 09/30/2018   Moderna Sars-Covid-2 Vaccination 02/23/2020, 03/22/2020   Pneumococcal Conjugate-13 06/29/2018   Pneumococcal Polysaccharide-23 02/15/2016, 04/14/2017   Tdap 05/01/2014   Zoster Recombinat (Shingrix) 12/11/2021, 06/10/2022    ROS: Denies dizziness, LOC, polyuria, polydipsia, unintended weight loss/gain, foot ulcerations,  shortness of breath or chest pain.   ROS: Per HPI  No Known Allergies Past Medical History:  Diagnosis Date   Arthritis    Diabetes mellitus without complication (HCC)    Elevated liver enzymes 07/01/2018   Gout of big toe 11/27/2017   Hypertension     Current Outpatient Medications:    acetaminophen (TYLENOL) 500 MG tablet, Take 500 mg by mouth every 6 (six) hours as needed for mild pain or moderate pain., Disp: , Rfl:    Alpha-Lipoic Acid 600 MG CAPS, TAKE ONE CAPSULE ONCE DAILY FOR DIABETIC NEUROPATHY IN FEET, Disp: 90 capsule, Rfl: 1   colchicine 0.6 MG tablet, Take 1 tablet (0.6 mg total) by mouth daily., Disp: 90 tablet, Rfl: 3   glipiZIDE (GLUCOTROL XL) 5 MG 24 hr tablet,  Take 1 tablet (5 mg total) by mouth daily with breakfast., Disp: 90 tablet, Rfl: 3   glucose blood test strip, Test BS daily Dx E11.42 One touch verio, Disp: 100 each, Rfl: 3   linaGLIPtin-metFORMIN HCl ER (JENTADUETO XR) 03-999 MG TB24, Take 1 tablet by mouth daily with breakfast., Disp: 90 tablet, Rfl: 3   lisinopril-hydrochlorothiazide (ZESTORETIC) 20-25 MG tablet, Take 1 tablet by mouth daily., Disp: 90 tablet, Rfl: 3   loratadine (CLARITIN) 10 MG tablet, TAKE 1 TABLET DAILY, Disp: 30 tablet, Rfl: 11   metoprolol tartrate (LOPRESSOR) 50 MG tablet, Take 1 tablet 2 (two) times daily., Disp: 180 tablet, Rfl: 3   amLODipine (NORVASC) 5 MG tablet, Take 1 tablet (5 mg total) by mouth daily., Disp: 90 tablet, Rfl: 3   atorvastatin (LIPITOR) 20 MG tablet, Take 1 tablet (20 mg total) by mouth every evening., Disp: 90 tablet, Rfl: 3   gabapentin (NEURONTIN) 300 MG capsule, TAKE ONE CAPSULE THREE TIMES DAILY, Disp: 270 capsule, Rfl: 3   omeprazole (PRILOSEC) 20 MG capsule, Take 1 capsule (20 mg total) by mouth daily., Disp: 90 capsule, Rfl: 3 Social History   Socioeconomic History   Marital status: Widowed    Spouse name: Not on file   Number of children: 2   Years of education: 9   Highest education level: 9th grade  Occupational History   Occupation: retired  Tobacco Use   Smoking status: Former    Years: 15.00    Types: Cigarettes    Quit date: 06/24/1999    Years since quitting: 23.4   Smokeless tobacco: Never  Vaping Use   Vaping Use: Never used  Substance and Sexual Activity   Alcohol use: No   Drug use: No   Sexual activity: Not Currently  Other Topics Concern   Not on file  Social History Narrative   Disabled   Her son and grandson live with her   Social Determinants of Health   Financial Resource Strain: Low Risk  (06/16/2022)   Overall Financial Resource Strain (CARDIA)    Difficulty of Paying Living Expenses: Not hard at all  Food Insecurity: No Food Insecurity  (06/16/2022)   Hunger Vital Sign    Worried About Running Out of Food in the Last Year: Never true    Ran Out of Food in the Last Year: Never true  Transportation Needs: No Transportation Needs (06/16/2022)   PRAPARE - Administrator, Civil Service (Medical): No    Lack of Transportation (Non-Medical): No  Physical Activity: Sufficiently Active (06/16/2022)   Exercise Vital Sign    Days of Exercise per Week: 7 days    Minutes of Exercise per Session: 30 min  Stress: No Stress Concern Present (06/16/2022)   [employer] of Occupational Health - Occupational Stress Questionnaire    Feeling of Stress : Not at all  Social Connections: Moderately Integrated (06/16/2022)   Social Connection and Isolation Panel [NHANES]    Frequency of Communication with Friends and Family: More than three times a week    Frequency of Social Gatherings with Friends and Family: More than three times a week    Attends Religious Services: More than 4 times per year    Active Member of [employer] or Organizations: Yes    Attends Banker Meetings: More than 4 times per year    Marital Status: Widowed  Intimate Partner Violence: Not At Risk (06/16/2022)   Humiliation, Afraid, Rape, and Kick questionnaire    Fear of Current or Ex-Partner: No    Emotionally Abused: No    Physically Abused: No    Sexually Abused: No   Family History  Problem Relation Age of Onset   Hypertension Mother    Cancer Mother        colon   Hypertension Father    Hypertension Sister    Congestive Heart Failure Sister    Aneurysm Sister    Hypertension Brother    Heart attack Brother    Pulmonary disease Brother     Objective: Office vital signs reviewed. BP 139/70   Pulse 74   Temp 98.6 F (37 C)   Ht 5\' 3"  (1.6 m)   Wt 204 lb (92.5 kg)   SpO2 95%   BMI 36.14 kg/m   Physical Examination:  General: Awake, alert, well nourished, No acute distress HEENT: sclera white, MMM Cardio: regular rate and  rhythm, S1S2 heard, 2/6 SEM appreciated Pulm: clear to auscultation bilaterally, no wheezes, rhonchi or rales; normal work of breathing on room air Extremities: No edema  Assessment/ Plan: 72 y.o. female   DM type 2 with diabetic dyslipidemia (HCC) - Plan: Microalbumin / creatinine urine ratio, Bayer DCA Hb A1c Waived, Lipid Panel, TSH, atorvastatin (LIPITOR) 20 MG tablet  Hypertension associated with diabetes (HCC) - Plan: CMP14+EGFR, Lipid Panel, amLODipine (NORVASC) 5 MG tablet  Diabetic polyneuropathy associated with type 2 diabetes mellitus (HCC) - Plan: CMP14+EGFR, gabapentin (NEURONTIN) 300 MG capsule  Need for immunization against influenza - Plan: Flu Vaccine QUAD High Dose(Fluad)  A1c well-controlled with A1c dropping to 6.1 today.  Discussed that if her A1c can get below 6 we may  get rid of the glipizide.  She is actively working on lifestyle modification with some success with weight loss.  Will check urine microalbumin and ROI completed for eye exam.  Plan for diabetic foot exam at next visit.  Blood pressure controlled upon recheck.  No changes  Neuropathy is stable.  Gabapentin renewed  Orders Placed This Encounter  Procedures   Flu Vaccine QUAD High Dose(Fluad)   Microalbumin / creatinine urine ratio   Bayer DCA Hb A1c Waived   CMP14+EGFR   Lipid Panel   TSH   Meds ordered this encounter  Medications   omeprazole (PRILOSEC) 20 MG capsule    Sig: Take 1 capsule (20 mg total) by mouth daily.    Dispense:  90 capsule    Refill:  3   amLODipine (NORVASC) 5 MG tablet    Sig: Take 1 tablet (5 mg total) by mouth daily.    Dispense:  90 tablet    Refill:  3   atorvastatin (LIPITOR) 20 MG tablet    Sig: Take 1 tablet (20 mg total) by mouth every evening.    Dispense:  90 tablet    Refill:  3   gabapentin (NEURONTIN) 300 MG capsule    Sig: TAKE ONE CAPSULE THREE TIMES DAILY    Dispense:  270 capsule    Refill:  [place], [place] [phone]

## 2022-12-12 NOTE — Patient Instructions (Addendum)
Keep working on weight loss and healthy lifestyle. If we can get your A1c below 6, we can get rid of Glipizide.

## 2022-12-13 LAB — CMP14+EGFR
ALT: 24 IU/L (ref 0–32)
AST: 31 IU/L (ref 0–40)
Albumin/Globulin Ratio: 1.8 (ref 1.2–2.2)
Albumin: 4.4 g/dL (ref 3.9–4.9)
Alkaline Phosphatase: 54 IU/L (ref 44–121)
BUN/Creatinine Ratio: 14 (ref 12–28)
BUN: 11 mg/dL (ref 8–27)
Bilirubin Total: 0.3 mg/dL (ref 0.0–1.2)
CO2: 23 mmol/L (ref 20–29)
Calcium: 10.4 mg/dL — ABNORMAL HIGH (ref 8.7–10.3)
Chloride: 99 mmol/L (ref 96–106)
Creatinine, Ser: 0.8 mg/dL (ref 0.57–1.00)
Globulin, Total: 2.4 g/dL (ref 1.5–4.5)
Glucose: 148 mg/dL — ABNORMAL HIGH (ref 70–99)
Potassium: 3.8 mmol/L (ref 3.5–5.2)
Sodium: 141 mmol/L (ref 134–144)
Total Protein: 6.8 g/dL (ref 6.0–8.5)
eGFR: 79 mL/min/{1.73_m2} (ref >59)

## 2022-12-13 LAB — LIPID PANEL
Chol/HDL Ratio: 3 ratio (ref 0.0–4.4)
Cholesterol, Total: 136 mg/dL (ref 100–199)
HDL: 45 mg/dL (ref >39)
LDL Chol Calc (NIH): 69 mg/dL (ref 0–99)
Triglycerides: 120 mg/dL (ref 0–149)
VLDL Cholesterol Cal: 22 mg/dL (ref 5–40)

## 2022-12-13 LAB — MICROALBUMIN / CREATININE URINE RATIO
Creatinine, Urine: 90.7 mg/dL
Microalb/Creat Ratio: 14 mg/g creat (ref 0–29)
Microalbumin, Urine: 12.9 ug/mL

## 2022-12-13 LAB — TSH: TSH: 2.21 u[IU]/mL (ref 0.450–4.500)

## 2022-12-16 ENCOUNTER — Other Ambulatory Visit: Payer: Self-pay | Admitting: Family Medicine

## 2022-12-16 ENCOUNTER — Telehealth: Payer: Self-pay | Admitting: Family Medicine

## 2022-12-16 DIAGNOSIS — I152 Hypertension secondary to endocrine disorders: Secondary | ICD-10-CM

## 2022-12-16 NOTE — Telephone Encounter (Signed)
Patient was told to discontinue a medication over the phone and does not remember what the medication was. Please call back and clarify.

## 2022-12-16 NOTE — Telephone Encounter (Signed)
Pt has been notified.

## 2023-02-02 ENCOUNTER — Other Ambulatory Visit: Payer: Self-pay | Admitting: Family Medicine

## 2023-02-02 DIAGNOSIS — E1142 Type 2 diabetes mellitus with diabetic polyneuropathy: Secondary | ICD-10-CM

## 2023-02-02 DIAGNOSIS — E1169 Type 2 diabetes mellitus with other specified complication: Secondary | ICD-10-CM

## 2023-02-02 DIAGNOSIS — I152 Hypertension secondary to endocrine disorders: Secondary | ICD-10-CM

## 2023-04-10 ENCOUNTER — Other Ambulatory Visit: Payer: Self-pay

## 2023-04-10 DIAGNOSIS — Z1231 Encounter for screening mammogram for malignant neoplasm of breast: Secondary | ICD-10-CM

## 2023-05-15 ENCOUNTER — Other Ambulatory Visit: Payer: Self-pay

## 2023-05-15 DIAGNOSIS — Z1211 Encounter for screening for malignant neoplasm of colon: Secondary | ICD-10-CM

## 2023-06-12 ENCOUNTER — Ambulatory Visit: Payer: Medicare Other | Admitting: Family Medicine

## 2023-06-12 ENCOUNTER — Encounter: Payer: Self-pay | Admitting: Family Medicine

## 2023-06-12 VITALS — BP 139/82 | HR 64 | Temp 98.7°F | Ht 63.0 in | Wt 206.0 lb

## 2023-06-12 DIAGNOSIS — E559 Vitamin D deficiency, unspecified: Secondary | ICD-10-CM | POA: Diagnosis not present

## 2023-06-12 DIAGNOSIS — J301 Allergic rhinitis due to pollen: Secondary | ICD-10-CM

## 2023-06-12 DIAGNOSIS — E1169 Type 2 diabetes mellitus with other specified complication: Secondary | ICD-10-CM

## 2023-06-12 DIAGNOSIS — E1159 Type 2 diabetes mellitus with other circulatory complications: Secondary | ICD-10-CM | POA: Diagnosis not present

## 2023-06-12 DIAGNOSIS — E1142 Type 2 diabetes mellitus with diabetic polyneuropathy: Secondary | ICD-10-CM

## 2023-06-12 DIAGNOSIS — Z1211 Encounter for screening for malignant neoplasm of colon: Secondary | ICD-10-CM

## 2023-06-12 DIAGNOSIS — M10372 Gout due to renal impairment, left ankle and foot: Secondary | ICD-10-CM

## 2023-06-12 DIAGNOSIS — K219 Gastro-esophageal reflux disease without esophagitis: Secondary | ICD-10-CM

## 2023-06-12 DIAGNOSIS — E785 Hyperlipidemia, unspecified: Secondary | ICD-10-CM

## 2023-06-12 DIAGNOSIS — I152 Hypertension secondary to endocrine disorders: Secondary | ICD-10-CM

## 2023-06-12 LAB — BAYER DCA HB A1C WAIVED: HB A1C (BAYER DCA - WAIVED): 6.6 % — ABNORMAL HIGH (ref 4.8–5.6)

## 2023-06-12 MED ORDER — LISINOPRIL-HYDROCHLOROTHIAZIDE 20-25 MG PO TABS
1.0000 | ORAL_TABLET | Freq: Every day | ORAL | 3 refills | Status: DC
Start: 2023-06-12 — End: 2023-12-14

## 2023-06-12 MED ORDER — COLCHICINE 0.6 MG PO TABS
ORAL_TABLET | ORAL | 3 refills | Status: DC
Start: 2023-06-12 — End: 2023-12-14

## 2023-06-12 MED ORDER — ATORVASTATIN CALCIUM 20 MG PO TABS
20.0000 mg | ORAL_TABLET | Freq: Every evening | ORAL | 3 refills | Status: DC
Start: 2023-06-12 — End: 2023-12-14

## 2023-06-12 MED ORDER — JENTADUETO XR 5-1000 MG PO TB24
1.0000 | ORAL_TABLET | Freq: Every day | ORAL | 3 refills | Status: DC
Start: 2023-06-12 — End: 2023-12-14

## 2023-06-12 MED ORDER — LORATADINE 10 MG PO TABS
10.0000 mg | ORAL_TABLET | Freq: Every day | ORAL | 3 refills | Status: DC
Start: 2023-06-12 — End: 2023-12-14

## 2023-06-12 MED ORDER — OMEPRAZOLE 20 MG PO CPDR
20.0000 mg | DELAYED_RELEASE_CAPSULE | Freq: Every day | ORAL | 3 refills | Status: DC
Start: 2023-06-12 — End: 2023-12-14

## 2023-06-12 MED ORDER — GLIPIZIDE ER 5 MG PO TB24
5.0000 mg | ORAL_TABLET | Freq: Every day | ORAL | 3 refills | Status: DC
Start: 2023-06-12 — End: 2023-12-14

## 2023-06-12 MED ORDER — AMLODIPINE BESYLATE 5 MG PO TABS
5.0000 mg | ORAL_TABLET | Freq: Every day | ORAL | 3 refills | Status: DC
Start: 2023-06-12 — End: 2023-12-14

## 2023-06-12 MED ORDER — METOPROLOL TARTRATE 50 MG PO TABS
ORAL_TABLET | ORAL | 3 refills | Status: DC
Start: 2023-06-12 — End: 2023-12-14

## 2023-06-12 MED ORDER — GABAPENTIN 300 MG PO CAPS
ORAL_CAPSULE | ORAL | 3 refills | Status: DC
Start: 2023-06-12 — End: 2023-12-14

## 2023-06-12 NOTE — Progress Notes (Unsigned)
Sheena Graham is a 72 y.o. female presents to office today for annual physical exam examination.    Concerns today include: 1. Type 2 Diabetes with hypertension, hyperlipidemia:  Glucometer:***.   High at home: ***; Low at home: ***, Taking medication(s): ***,.  Last eye exam: UTD Last foot exam: needs Last A1c:  Lab Results  Component Value Date   HGBA1C 6.1 (H) 12/12/2022   Nephropathy screen indicated?: needs Last flu, zoster and/or pneumovax:  Immunization History  Administered Date(s) Administered   Fluad Quad(high Dose 65+) 11/27/2017, 09/30/2018, 12/11/2021, 12/12/2022   Influenza, High Dose Seasonal PF 11/27/2017, 09/30/2018   Influenza, Seasonal, Injecte, Preservative Fre 09/24/2015   Influenza,inj,Quad PF,6+ Mos 09/11/2016   Influenza-Unspecified 09/24/2015, 11/27/2017, 09/30/2018   Moderna Sars-Covid-2 Vaccination 02/23/2020, 03/22/2020   Pneumococcal Conjugate-13 06/29/2018   Pneumococcal Polysaccharide-23 02/15/2016, 04/14/2017   Tdap 05/01/2014   Zoster Recombinant(Shingrix) 12/11/2021, 06/10/2022    ROS: ***dizziness, LOC, polyuria, polydipsia, unintended weight loss/gain, foot ulcerations, numbness or tingling in extremities, shortness of breath or chest pain.   Occupation: ***, Marital status: ***, Substance use: *** Health Maintenance Due  Topic Date Due   COVID-19 Vaccine (3 - 2023-24 season) 07/25/2022   FOOT EXAM  12/11/2022   Fecal DNA (Cologuard)  05/08/2023   HEMOGLOBIN A1C  06/12/2023   Medicare Annual Wellness (AWV)  06/17/2023   Refills needed today: *** Immunizations needed: Immunization History  Administered Date(s) Administered   Fluad Quad(high Dose 65+) 11/27/2017, 09/30/2018, 12/11/2021, 12/12/2022   Influenza, High Dose Seasonal PF 11/27/2017, 09/30/2018   Influenza, Seasonal, Injecte, Preservative Fre 09/24/2015   Influenza,inj,Quad PF,6+ Mos 09/11/2016   Influenza-Unspecified 09/24/2015, 11/27/2017, 09/30/2018   Moderna  Sars-Covid-2 Vaccination 02/23/2020, 03/22/2020   Pneumococcal Conjugate-13 06/29/2018   Pneumococcal Polysaccharide-23 02/15/2016, 04/14/2017   Tdap 05/01/2014   Zoster Recombinant(Shingrix) 12/11/2021, 06/10/2022     Past Medical History:  Diagnosis Date   Arthritis    Diabetes mellitus without complication (HCC)    Elevated liver enzymes 07/01/2018   Gout of big toe 11/27/2017   Hypertension    Social History   Socioeconomic History   Marital status: Widowed    Spouse name: Not on file   Number of children: 2   Years of education: 9   Highest education level: 9th grade  Occupational History   Occupation: retired  Tobacco Use   Smoking status: Former    Current packs/day: 0.00    Types: Cigarettes    Start date: 06/23/1984    Quit date: 06/24/1999    Years since quitting: 23.9   Smokeless tobacco: Never  Vaping Use   Vaping status: Never Used  Substance and Sexual Activity   Alcohol use: No   Drug use: No   Sexual activity: Not Currently  Other Topics Concern   Not on file  Social History Narrative   Disabled   Her son and grandson live with her   Social Determinants of Health   Financial Resource Strain: Low Risk  (06/16/2022)   Overall Financial Resource Strain (CARDIA)    Difficulty of Paying Living Expenses: Not hard at all  Food Insecurity: No Food Insecurity (06/16/2022)   Hunger Vital Sign    Worried About Running Out of Food in the Last Year: Never true    Ran Out of Food in the Last Year: Never true  Transportation Needs: No Transportation Needs (06/16/2022)   PRAPARE - Administrator, Civil Service (Medical): No    Lack of Transportation (Non-Medical): No  Physical Activity: Sufficiently Active (06/16/2022)   Exercise Vital Sign    Days of Exercise per Week: 7 days    Minutes of Exercise per Session: 30 min  Stress: No Stress Concern Present (06/16/2022)   [employer] of Occupational Health - Occupational Stress Questionnaire     Feeling of Stress : Not at all  Social Connections: Moderately Integrated (06/16/2022)   Social Connection and Isolation Panel [NHANES]    Frequency of Communication with Friends and Family: More than three times a week    Frequency of Social Gatherings with Friends and Family: More than three times a week    Attends Religious Services: More than 4 times per year    Active Member of [employer] or Organizations: Yes    Attends Banker Meetings: More than 4 times per year    Marital Status: Widowed  Intimate Partner Violence: Not At Risk (06/16/2022)   Humiliation, Afraid, Rape, and Kick questionnaire    Fear of Current or Ex-Partner: No    Emotionally Abused: No    Physically Abused: No    Sexually Abused: No   No past surgical history on file. Family History  Problem Relation Age of Onset   Hypertension Mother    Cancer Mother        colon   Hypertension Father    Hypertension Sister    Congestive Heart Failure Sister    Aneurysm Sister    Hypertension Brother    Heart attack Brother    Pulmonary disease Brother     Current Outpatient Medications:    acetaminophen (TYLENOL) 500 MG tablet, Take 500 mg by mouth every 6 (six) hours as needed for mild pain or moderate pain., Disp: , Rfl:    Alpha-Lipoic Acid 600 MG CAPS, TAKE ONE CAPSULE ONCE DAILY FOR DIABETIC NEUROPATHY IN FEET, Disp: 90 capsule, Rfl: 1   amLODipine (NORVASC) 5 MG tablet, Take 1 tablet (5 mg total) by mouth daily., Disp: 90 tablet, Rfl: 3   atorvastatin (LIPITOR) 20 MG tablet, Take 1 tablet (20 mg total) by mouth every evening., Disp: 90 tablet, Rfl: 3   colchicine 0.6 MG tablet, TAKE ONE TABLET ONCE DAILY, Disp: 90 tablet, Rfl: 1   gabapentin (NEURONTIN) 300 MG capsule, TAKE ONE CAPSULE THREE TIMES DAILY, Disp: 270 capsule, Rfl: 3   glipiZIDE (GLUCOTROL XL) 5 MG 24 hr tablet, TAKE ONE TABLET ONCE DAILY WITH BREAKFAST, Disp: 90 tablet, Rfl: 1   glucose blood test strip, Test BS daily Dx E11.42 One touch  verio, Disp: 100 each, Rfl: 3   linaGLIPtin-metFORMIN HCl ER (JENTADUETO XR) 03-999 MG TB24, TAKE ONE TABLET DAILY WITH BREAKFAST, Disp: 90 tablet, Rfl: 1   lisinopril-hydrochlorothiazide (ZESTORETIC) 20-25 MG tablet, TAKE ONE TABLET ONCE DAILY, Disp: 90 tablet, Rfl: 1   loratadine (CLARITIN) 10 MG tablet, TAKE 1 TABLET DAILY, Disp: 90 tablet, Rfl: 1   metoprolol tartrate (LOPRESSOR) 50 MG tablet, TAKE ONE TABLET TWICE DAILY, Disp: 180 tablet, Rfl: 1   omeprazole (PRILOSEC) 20 MG capsule, Take 1 capsule (20 mg total) by mouth daily., Disp: 90 capsule, Rfl: 3  No Known Allergies   ROS: Review of Systems {ros; complete:30496}    Physical exam {Exam, Complete:[phone]}      12/12/2022    8:20 AM 06/16/2022    8:18 AM 12/11/2021   10:14 AM  Depression screen PHQ 2/9  Decreased Interest 0 0 0  Down, Depressed, Hopeless 0 0 0  PHQ - 2 Score 0 0 0  Altered sleeping 0    Tired, decreased energy 0    Change in appetite 0    Feeling bad or failure about yourself  0    Trouble concentrating 0    Moving slowly or fidgety/restless 0    Suicidal thoughts 0    PHQ-9 Score 0    Difficult doing work/chores Not difficult at all        12/11/2021   10:14 AM 06/10/2021    9:00 AM  GAD 7 : Generalized Anxiety Score  Nervous, Anxious, on Edge 0 0  Control/stop worrying 0 0  Worry too much - different things 0 0  Trouble relaxing 0 0  Restless 0 0  Easily annoyed or irritable 0 0  Afraid - awful might happen 0 0  Total GAD 7 Score 0 0  Anxiety Difficulty Not difficult at all Not difficult at all     Assessment/ Plan: Sheena Graham here for annual physical exam.   DM type 2 with diabetic dyslipidemia (HCC)  Hypertension associated with diabetes (HCC)  Diabetic polyneuropathy associated with type 2 diabetes mellitus (HCC)  Vitamin D deficiency  ***  Counseled on healthy lifestyle choices, including diet (rich in fruits, vegetables and lean meats and low in salt and simple  carbohydrates) and exercise (at least 30 minutes of moderate physical activity daily).  Patient to follow up ***  Lilliane Sposito M. Nadine Counts, DO

## 2023-06-12 NOTE — Patient Instructions (Signed)
Preventive Care 65 Years and Older, Female Preventive care refers to lifestyle choices and visits with your health care provider that can promote health and wellness. Preventive care visits are also called wellness exams. What can I expect for my preventive care visit? Counseling Your health care provider may ask you questions about your: Medical history, including: Past medical problems. Family medical history. Pregnancy and menstrual history. History of falls. Current health, including: Memory and ability to understand (cognition). Emotional well-being. Home life and relationship well-being. Sexual activity and sexual health. Lifestyle, including: Alcohol, nicotine or tobacco, and drug use. Access to firearms. Diet, exercise, and sleep habits. Work and work environment. Sunscreen use. Safety issues such as seatbelt and bike helmet use. Physical exam Your health care provider will check your: Height and weight. These may be used to calculate your BMI (body mass index). BMI is a measurement that tells if you are at a healthy weight. Waist circumference. This measures the distance around your waistline. This measurement also tells if you are at a healthy weight and may help predict your risk of certain diseases, such as type 2 diabetes and high blood pressure. Heart rate and blood pressure. Body temperature. Skin for abnormal spots. What immunizations do I need?  Vaccines are usually given at various ages, according to a schedule. Your health care provider will recommend vaccines for you based on your age, medical history, and lifestyle or other factors, such as travel or where you work. What tests do I need? Screening Your health care provider may recommend screening tests for certain conditions. This may include: Lipid and cholesterol levels. Hepatitis C test. Hepatitis B test. HIV (human immunodeficiency virus) test. STI (sexually transmitted infection) testing, if you are at  risk. Lung cancer screening. Colorectal cancer screening. Diabetes screening. This is done by checking your blood sugar (glucose) after you have not eaten for a while (fasting). Mammogram. Talk with your health care provider about how often you should have regular mammograms. BRCA-related cancer screening. This may be done if you have a family history of breast, ovarian, tubal, or peritoneal cancers. Bone density scan. This is done to screen for osteoporosis. Talk with your health care provider about your test results, treatment options, and if necessary, the need for more tests. Follow these instructions at home: Eating and drinking  Eat a diet that includes fresh fruits and vegetables, whole grains, lean protein, and low-fat dairy products. Limit your intake of foods with high amounts of sugar, saturated fats, and salt. Take vitamin and mineral supplements as recommended by your health care provider. Do not drink alcohol if your health care provider tells you not to drink. If you drink alcohol: Limit how much you have to 0-1 drink a day. Know how much alcohol is in your drink. In the [place] one drink equals one 12 oz bottle of beer (355 mL), one 5 oz glass of wine (148 mL), or one 1 oz glass of hard liquor (44 mL). Lifestyle Brush your teeth every morning and night with fluoride toothpaste. Floss one time each day. Exercise for at least 30 minutes 5 or more days each week. Do not use any products that contain nicotine or tobacco. These products include cigarettes, chewing tobacco, and vaping devices, such as e-cigarettes. If you need help quitting, ask your health care provider. Do not use drugs. If you are sexually active, practice safe sex. Use a condom or other form of protection in order to prevent STIs. Take aspirin only as told by   your health care provider. Make sure that you understand how much to take and what form to take. Work with your health care provider to find out whether it  is safe and beneficial for you to take aspirin daily. Ask your health care provider if you need to take a cholesterol-lowering medicine (statin). Find healthy ways to manage stress, such as: Meditation, yoga, or listening to music. Journaling. Talking to a trusted person. Spending time with friends and family. Minimize exposure to UV radiation to reduce your risk of skin cancer. Safety Always wear your seat belt while driving or riding in a vehicle. Do not drive: If you have been drinking alcohol. Do not ride with someone who has been drinking. When you are tired or distracted. While texting. If you have been using any mind-altering substances or drugs. Wear a helmet and other protective equipment during sports activities. If you have firearms in your house, make sure you follow all gun safety procedures. What's next? Visit your health care provider once a year for an annual wellness visit. Ask your health care provider how often you should have your eyes and teeth checked. Stay up to date on all vaccines. This information is not intended to replace advice given to you by your health care provider. Make sure you discuss any questions you have with your health care provider. Document Revised: 05/08/2021 Document Reviewed: 05/08/2021 Elsevier Patient Education  2024 Elsevier Inc.  

## 2023-06-13 LAB — VITAMIN D 25 HYDROXY (VIT D DEFICIENCY, FRACTURES): Vit D, 25-Hydroxy: 35.7 ng/mL (ref 30.0–100.0)

## 2023-06-18 ENCOUNTER — Ambulatory Visit (INDEPENDENT_AMBULATORY_CARE_PROVIDER_SITE_OTHER): Payer: Medicare Other

## 2023-06-18 VITALS — Ht 63.0 in | Wt 206.0 lb

## 2023-06-18 DIAGNOSIS — Z0000 Encounter for general adult medical examination without abnormal findings: Secondary | ICD-10-CM

## 2023-06-18 NOTE — Progress Notes (Signed)
Subjective:   Sheena Graham is a 72 y.o. female who presents for Medicare Annual (Subsequent) preventive examination.  Visit Complete: Virtual  I connected with  Sheena Graham on 06/18/23 by a audio enabled telemedicine application and verified that I am speaking with the correct person using two identifiers.  Patient Location: Home  Provider Location: Home Office  I discussed the limitations of evaluation and management by telemedicine. The patient expressed understanding and agreed to proceed.  Patient Medicare AWV questionnaire was completed by the patient on 06/18/2023; I have confirmed that all information answered by patient is correct and no changes since this date.  Review of Systems    Nutrition Risk Assessment:  Has the patient had any N/V/D within the last 2 months?  No  Does the patient have any non-healing wounds?  No  Has the patient had any unintentional weight loss or weight gain?  No   Diabetes:  Is the patient diabetic?  Yes  If diabetic, was a CBG obtained today?  No  Did the patient bring in their glucometer from home?  No  How often do you monitor your CBG's? Never .   Financial Strains and Diabetes Management:  Are you having any financial strains with the device, your supplies or your medication? No .  Does the patient want to be seen by Chronic Care Management for management of their diabetes?  No  Would the patient like to be referred to a Nutritionist or for Diabetic Management?  No   Diabetic Exams:  Diabetic Eye Exam: Completed 07/2022 Diabetic Foot Exam: Overdue, Pt has been advised about the importance in completing this exam. Pt is scheduled for diabetic foot exam on next office visit .  Cardiac Risk Factors include: advanced age (>28men, >31 women);diabetes mellitus;dyslipidemia;hypertension;sedentary lifestyle     Objective:    Today's Vitals   06/18/23 0817  Weight: 206 lb (93.4 kg)  Height: 5\' 3"  (1.6 m)   Body mass index is  36.49 kg/m.     06/18/2023    8:21 AM 06/16/2022    8:21 AM 04/04/2020    8:52 AM 03/09/2016    2:09 PM 12/31/2015   11:24 AM  Advanced Directives  Does Patient Have a Medical Advance Directive? No No No No No  Would patient like information on creating a medical advance directive? Yes (MAU/Ambulatory/Procedural Areas - Information given) No - Patient declined No - Patient declined  No - patient declined information    Current Medications (verified) Outpatient Encounter Medications as of 06/18/2023  Medication Sig   acetaminophen (TYLENOL) 500 MG tablet Take 500 mg by mouth every 6 (six) hours as needed for mild pain or moderate pain.   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   atorvastatin (LIPITOR) 20 MG tablet Take 1 tablet (20 mg total) by mouth every evening.   colchicine 0.6 MG tablet Day 1 of gout flare up, take 2 tablets by mouth.  Then take 1 tablet daily until gout flare resolved.   gabapentin (NEURONTIN) 300 MG capsule TAKE ONE CAPSULE THREE TIMES DAILY   glipiZIDE (GLUCOTROL XL) 5 MG 24 hr tablet Take 1 tablet (5 mg total) by mouth daily with breakfast.   glucose blood test strip Test BS daily Dx E11.42 One touch verio   linaGLIPtin-metFORMIN HCl ER (JENTADUETO XR) 03-999 MG TB24 Take 1  tablet by mouth daily.   lisinopril-hydrochlorothiazide (ZESTORETIC) 20-25 MG tablet Take 1 tablet by mouth daily.   loratadine (CLARITIN) 10 MG tablet Take 1 tablet (10 mg total) by mouth daily.   metoprolol tartrate (LOPRESSOR) 50 MG tablet TAKE ONE TABLET TWICE DAILY   omeprazole (PRILOSEC) 20 MG capsule Take 1 capsule (20 mg total) by mouth daily.   No facility-administered encounter medications on file as of 06/18/2023.    Allergies (verified) Patient has no known allergies.   History: Past Medical History:  Diagnosis Date   Arthritis    Diabetes mellitus  without complication (HCC)    Elevated liver enzymes 07/01/2018   Gout of big toe 11/27/2017   Hypertension    History reviewed. No pertinent surgical history. Family History  Problem Relation Age of Onset   Hypertension Mother    Cancer Mother        colon   Hypertension Father    Hypertension Sister    Congestive Heart Failure Sister    Aneurysm Sister    Hypertension Brother    Heart attack Brother    Pulmonary disease Brother    Social History   Socioeconomic History   Marital status: Widowed    Spouse name: Not on file   Number of children: 2   Years of education: 9   Highest education level: 9th grade  Occupational History   Occupation: retired  Tobacco Use   Smoking status: Former    Current packs/day: 0.00    Types: Cigarettes    Start date: 06/23/1984    Quit date: 06/24/1999    Years since quitting: 24.0   Smokeless tobacco: Never  Vaping Use   Vaping status: Never Used  Substance and Sexual Activity   Alcohol use: No   Drug use: No   Sexual activity: Not Currently  Other Topics Concern   Not on file  Social History Narrative   Disabled   Her son and grandson live with her   Social Determinants of Health   Financial Resource Strain: Low Risk  (06/18/2023)   Overall Financial Resource Strain (CARDIA)    Difficulty of Paying Living Expenses: Not hard at all  Food Insecurity: No Food Insecurity (06/18/2023)   Hunger Vital Sign    Worried About Running Out of Food in the Last Year: Never true    Ran Out of Food in the Last Year: Never true  Transportation Needs: No Transportation Needs (06/16/2022)   PRAPARE - Administrator, Civil Service (Medical): No    Lack of Transportation (Non-Medical): No  Physical Activity: Inactive (06/18/2023)   Exercise Vital Sign    Days of Exercise per Week: 0 days    Minutes of Exercise per Session: 0 min  Stress: No Stress Concern Present (06/18/2023)   [employer] of Occupational Health - Occupational  Stress Questionnaire    Feeling of Stress : Not at all  Social Connections: Moderately Isolated (06/18/2023)   Social Connection and Isolation Panel [NHANES]    Frequency of Communication with Friends and Family: More than three times a week    Frequency of Social Gatherings with Friends and Family: More than three times a week    Attends Religious Services: More than 4 times per year    Active Member of [employer] or Organizations: No    Attends Banker Meetings: Never    Marital Status: Widowed    Tobacco Counseling Counseling given: Not Answered   Clinical Intake:  Pre-visit preparation  completed: Yes  Pain : No/denies pain     Nutritional Risks: None Diabetes: Yes CBG done?: No Did pt. bring in CBG monitor from home?: No  How often do you need to have someone help you when you read instructions, pamphlets, or other written materials from your doctor or pharmacy?: 1 - Never  Interpreter Needed?: No  Information entered by :: Renie Ora, LPN   Activities of Daily Living    06/18/2023    8:21 AM  In your present state of health, do you have any difficulty performing the following activities:  Hearing? 0  Vision? 0  Difficulty concentrating or making decisions? 0  Walking or climbing stairs? 0  Dressing or bathing? 0  Doing errands, shopping? 0  Preparing Food and eating ? N  Using the Toilet? N  In the past six months, have you accidently leaked urine? N  Do you have problems with loss of bowel control? N  Managing your Medications? N  Managing your Finances? N  Housekeeping or managing your Housekeeping? N    Patient Care Team: Raliegh Ip, DO as PCP - General (Family Medicine) Delora Fuel, OD (Optometry)  Indicate any recent Medical Services you may have received from other than [facility] providers in the past year (date may be approximate).     Assessment:   This is a routine wellness examination for Sheena Graham.  Hearing/Vision  screen Vision Screening - Comments:: Wears rx glasses - up to date with routine eye exams with  Dr.Johnson   Dietary issues and exercise activities discussed:     Goals Addressed             This Visit's Progress    DIET - INCREASE WATER INTAKE         Depression Screen    06/18/2023    8:20 AM 06/17/2023    4:37 PM 12/12/2022    8:20 AM 06/16/2022    8:18 AM 12/11/2021   10:14 AM 06/10/2021    9:00 AM 12/07/2020   11:13 AM  PHQ 2/9 Scores  PHQ - 2 Score 0 0 0 0 0 0 0  PHQ- 9 Score 0 0 0    0    Fall Risk    06/18/2023    8:19 AM 06/12/2023    8:05 AM 12/12/2022    8:20 AM 06/16/2022    8:16 AM 12/11/2021   10:14 AM  Fall Risk   Falls in the past year? 0 0 0 0 0  Number falls in past yr: 0 0 0 0   Injury with Fall? 0 0 0 0   Risk for fall due to : No Fall Risks No Fall Risks No Fall Risks No Fall Risks   Follow up Falls prevention discussed Education provided Education provided Falls prevention discussed     MEDICARE RISK AT HOME:  Medicare Risk at Home - 06/18/23 0819     Any stairs in or around the home? No    If so, are there any without handrails? No    Home free of loose throw rugs in walkways, pet beds, electrical cords, etc? Yes    Adequate lighting in your home to reduce risk of falls? Yes    Life alert? No    Use of a cane, walker or w/c? No    Grab bars in the bathroom? No    Shower chair or bench in shower? No    Elevated toilet seat or a handicapped toilet? No  TIMED UP AND GO:  Was the test performed?  No    Cognitive Function:        06/18/2023    8:22 AM 06/16/2022    8:21 AM 04/04/2020    8:45 AM  6CIT Screen  What Year? 0 points 0 points 0 points  What month? 0 points 0 points 0 points  What time? 0 points 0 points 0 points  Count back from 20 0 points 0 points 0 points  Months in reverse 0 points 4 points 4 points  Repeat phrase 0 points 4 points 6 points  Total Score 0 points 8 points 10 points     Immunizations Immunization History  Administered Date(s) Administered   Fluad Quad(high Dose 65+) 11/27/2017, 09/30/2018, 12/11/2021, 12/12/2022   Influenza, High Dose Seasonal PF 11/27/2017, 09/30/2018   Influenza, Seasonal, Injecte, Preservative Fre 09/24/2015   Influenza,inj,Quad PF,6+ Mos 09/11/2016   Influenza-Unspecified 09/24/2015, 11/27/2017, 09/30/2018   Moderna Sars-Covid-2 Vaccination 02/23/2020, 03/22/2020   Pneumococcal Conjugate-13 06/29/2018   Pneumococcal Polysaccharide-23 02/15/2016, 04/14/2017   Tdap 05/01/2014   Zoster Recombinant(Shingrix) 12/11/2021, 06/10/2022    TDAP status: Up to date  Flu Vaccine status: Up to date  Pneumococcal vaccine status: Up to date  Covid-19 vaccine status: Completed vaccines  Qualifies for Shingles Vaccine? Yes   Zostavax completed Yes   Shingrix Completed?: Yes  Screening Tests Health Maintenance  Topic Date Due   COVID-19 Vaccine (3 - 2023-24 season) 07/25/2022   Fecal DNA (Cologuard)  05/08/2023   MAMMOGRAM  06/17/2023   INFLUENZA VACCINE  06/25/2023   OPHTHALMOLOGY EXAM  10/16/2023   Diabetic kidney evaluation - eGFR measurement  12/13/2023   Diabetic kidney evaluation - Urine ACR  12/13/2023   HEMOGLOBIN A1C  12/13/2023   DTaP/Tdap/Td (2 - Td or Tdap) 05/01/2024   FOOT EXAM  06/11/2024   Medicare Annual Wellness (AWV)  06/17/2024   DEXA SCAN  06/13/2027   Pneumonia Vaccine 16+ Years old  Completed   Hepatitis C Screening  Completed   Zoster Vaccines- Shingrix  Completed   HPV VACCINES  Aged Out    Health Maintenance  Health Maintenance Due  Topic Date Due   COVID-19 Vaccine (3 - 2023-24 season) 07/25/2022   Fecal DNA (Cologuard)  05/08/2023   MAMMOGRAM  06/17/2023   Per patient no change in vitals since last visit; unable to obtain new vitals due to this being a telehealth visit.  Patient was unable to self-report vital signs via telehealth due to a lack of equipment at home.  Colorectal cancer  screening: Referral to GI placed has cologuard kit to complete. Pt aware the office will call re: appt.  Mammogram status: Ordered schedule 06/29/2023. Pt provided with contact info and advised to call to schedule appt.   Bone Density status: Completed 06/12/2022. Results reflect: Bone density results: OSTEOPENIA. Repeat every 5 years.  Lung Cancer Screening: (Low Dose CT Chest recommended if Age 38-80 years, 20 pack-year currently smoking OR have quit w/in 15years.) does not qualify.   Lung Cancer Screening Referral: n/a  Additional Screening:  Hepatitis C Screening: does not qualify; Completed 07/02/2018  Vision Screening: Recommended annual ophthalmology exams for early detection of glaucoma and other disorders of the eye. Is the patient up to date with their annual eye exam?  Yes  Who is the provider or what is the name of the office in which the patient attends annual eye exams? Dr.Johnson  If pt is not established with a provider, would they like  to be referred to a provider to establish care? No .   Dental Screening: Recommended annual dental exams for proper oral hygiene    Community Resource Referral / Chronic Care Management: CRR required this visit?  No   CCM required this visit?  No     Plan:     I have personally reviewed and noted the following in the patient's chart:   Medical and social history Use of alcohol, tobacco or illicit drugs  Current medications and supplements including opioid prescriptions. Patient is not currently taking opioid prescriptions. Functional ability and status Nutritional status Physical activity Advanced directives List of other physicians Hospitalizations, surgeries, and ER visits in previous 12 months Vitals Screenings to include cognitive, depression, and falls Referrals and appointments  In addition, I have reviewed and discussed with patient certain preventive protocols, quality metrics, and best practice recommendations.  A written personalized care plan for preventive services as well as general preventive health recommendations were provided to patient.     Lorrene Reid, LPN   [id number]/[id number]   After Visit Summary: (MyChart) Due to this being a telephonic visit, the after visit summary with patients personalized plan was offered to patient via MyChart   Nurse Notes: none

## 2023-06-18 NOTE — Patient Instructions (Signed)
Sheena Graham , Thank you for taking time to come for your Medicare Wellness Visit. I appreciate your ongoing commitment to your health goals. Please review the following plan we discussed and let me know if I can assist you in the future.   Referrals/Orders/Follow-Ups/Clinician Recommendations: Aim for 30 minutes of exercise or brisk walking, 6-8 glasses of water, and 5 servings of fruits and vegetables each day.   This is a list of the screening recommended for you and due dates:  Health Maintenance  Topic Date Due   COVID-19 Vaccine (3 - 2023-24 season) 07/25/2022   Cologuard (Stool DNA test)  05/08/2023   Mammogram  06/17/2023   Flu Shot  06/25/2023   Eye exam for diabetics  10/16/2023   Yearly kidney function blood test for diabetes  12/13/2023   Yearly kidney health urinalysis for diabetes  12/13/2023   Hemoglobin A1C  12/13/2023   DTaP/Tdap/Td vaccine (2 - Td or Tdap) 05/01/2024   Complete foot exam   06/11/2024   Medicare Annual Wellness Visit  06/17/2024   DEXA scan (bone density measurement)  06/13/2027   Pneumonia Vaccine  Completed   Hepatitis C Screening  Completed   Zoster (Shingles) Vaccine  Completed   HPV Vaccine  Aged Out    Advanced directives: (Provided) Advance directive discussed with you today. I have provided a copy for you to complete at home and have notarized. Once this is complete, please bring a copy in to our office so we can scan it into your chart. Information on Advanced Care Planning can be found at [facility] Advance Health Care Directives Advance Health Care Directives ([email or web address]    Next Medicare Annual Wellness Visit scheduled for next year: Yes  Preventive Care 65 Years and Older, Female Preventive care refers to lifestyle choices and visits with your health care provider that can promote health and wellness. What does preventive care include? A yearly physical exam. This is also called an annual well check. Dental exams once  or twice a year. Routine eye exams. Ask your health care provider how often you should have your eyes checked. Personal lifestyle choices, including: Daily care of your teeth and gums. Regular physical activity. Eating a healthy diet. Avoiding tobacco and drug use. Limiting alcohol use. Practicing safe sex. Taking low-dose aspirin every day. Taking vitamin and mineral supplements as recommended by your health care provider. What happens during an annual well check? The services and screenings done by your health care provider during your annual well check will depend on your age, overall health, lifestyle risk factors, and family history of disease. Counseling  Your health care provider may ask you questions about your: Alcohol use. Tobacco use. Drug use. Emotional well-being. Home and relationship well-being. Sexual activity. Eating habits. History of falls. Memory and ability to understand (cognition). Work and work Astronomer. Reproductive health. Screening  You may have the following tests or measurements: Height, weight, and BMI. Blood pressure. Lipid and cholesterol levels. These may be checked every 5 years, or more frequently if you are over 13 years old. Skin check. Lung cancer screening. You may have this screening every year starting at age 37 if you have a 30-pack-year history of smoking and currently smoke or have quit within the past 15 years. Fecal occult blood test (FOBT) of the stool. You may have this test every year starting at age 10. Flexible sigmoidoscopy or colonoscopy. You may have a sigmoidoscopy every 5 years or a colonoscopy every 10  years starting at age 59. Hepatitis C blood test. Hepatitis B blood test. Sexually transmitted disease (STD) testing. Diabetes screening. This is done by checking your blood sugar (glucose) after you have not eaten for a while (fasting). You may have this done every 1-3 years. Bone density scan. This is done to screen  for osteoporosis. You may have this done starting at age 25. Mammogram. This may be done every 1-2 years. Talk to your health care provider about how often you should have regular mammograms. Talk with your health care provider about your test results, treatment options, and if necessary, the need for more tests. Vaccines  Your health care provider may recommend certain vaccines, such as: Influenza vaccine. This is recommended every year. Tetanus, diphtheria, and acellular pertussis (Tdap, Td) vaccine. You may need a Td booster every 10 years. Zoster vaccine. You may need this after age 31. Pneumococcal 13-valent conjugate (PCV13) vaccine. One dose is recommended after age 4. Pneumococcal polysaccharide (PPSV23) vaccine. One dose is recommended after age 73. Talk to your health care provider about which screenings and vaccines you need and how often you need them. This information is not intended to replace advice given to you by your health care provider. Make sure you discuss any questions you have with your health care provider. Document Released: 12/07/2015 Document Revised: 07/30/2016 Document Reviewed: 09/11/2015 Elsevier Interactive Patient Education  2017 [employer].  Fall Prevention in the Home Falls can cause injuries. They can happen to people of all ages. There are many things you can do to make your home safe and to help prevent falls. What can I do on the outside of my home? Regularly fix the edges of walkways and driveways and fix any cracks. Remove anything that might make you trip as you walk through a door, such as a raised step or threshold. Trim any bushes or trees on the path to your home. Use bright outdoor lighting. Clear any walking paths of anything that might make someone trip, such as rocks or tools. Regularly check to see if handrails are loose or broken. Make sure that both sides of any steps have handrails. Any raised decks and porches should have guardrails  on the edges. Have any leaves, snow, or ice cleared regularly. Use sand or salt on walking paths during winter. Clean up any spills in your garage right away. This includes oil or grease spills. What can I do in the bathroom? Use night lights. Install grab bars by the toilet and in the tub and shower. Do not use towel bars as grab bars. Use non-skid mats or decals in the tub or shower. If you need to sit down in the shower, use a plastic, non-slip stool. Keep the floor dry. Clean up any water that spills on the floor as soon as it happens. Remove soap buildup in the tub or shower regularly. Attach bath mats securely with double-sided non-slip rug tape. Do not have throw rugs and other things on the floor that can make you trip. What can I do in the bedroom? Use night lights. Make sure that you have a light by your bed that is easy to reach. Do not use any sheets or blankets that are too big for your bed. They should not hang down onto the floor. Have a firm chair that has side arms. You can use this for support while you get dressed. Do not have throw rugs and other things on the floor that can make you trip.  What can I do in the kitchen? Clean up any spills right away. Avoid walking on wet floors. Keep items that you use a lot in easy-to-reach places. If you need to reach something above you, use a strong step stool that has a grab bar. Keep electrical cords out of the way. Do not use floor polish or wax that makes floors slippery. If you must use wax, use non-skid floor wax. Do not have throw rugs and other things on the floor that can make you trip. What can I do with my stairs? Do not leave any items on the stairs. Make sure that there are handrails on both sides of the stairs and use them. Fix handrails that are broken or loose. Make sure that handrails are as long as the stairways. Check any carpeting to make sure that it is firmly attached to the stairs. Fix any carpet that is  loose or worn. Avoid having throw rugs at the top or bottom of the stairs. If you do have throw rugs, attach them to the floor with carpet tape. Make sure that you have a light switch at the top of the stairs and the bottom of the stairs. If you do not have them, ask someone to add them for you. What else can I do to help prevent falls? Wear shoes that: Do not have high heels. Have rubber bottoms. Are comfortable and fit you well. Are closed at the toe. Do not wear sandals. If you use a stepladder: Make sure that it is fully opened. Do not climb a closed stepladder. Make sure that both sides of the stepladder are locked into place. Ask someone to hold it for you, if possible. Clearly mark and make sure that you can see: Any grab bars or handrails. First and last steps. Where the edge of each step is. Use tools that help you move around (mobility aids) if they are needed. These include: Canes. Walkers. Scooters. Crutches. Turn on the lights when you go into a dark area. Replace any light bulbs as soon as they burn out. Set up your furniture so you have a clear path. Avoid moving your furniture around. If any of your floors are uneven, fix them. If there are any pets around you, be aware of where they are. Review your medicines with your doctor. Some medicines can make you feel dizzy. This can increase your chance of falling. Ask your doctor what other things that you can do to help prevent falls. This information is not intended to replace advice given to you by your health care provider. Make sure you discuss any questions you have with your health care provider. Document Released: 09/06/2009 Document Revised: 04/17/2016 Document Reviewed: 12/15/2014 Elsevier Interactive Patient Education  2017 [employer].

## 2023-06-29 ENCOUNTER — Ambulatory Visit
Admission: RE | Admit: 2023-06-29 | Discharge: 2023-06-29 | Disposition: A | Discharge status: Home / Self Care | Payer: Medicare Other | Source: Ambulatory Visit | Attending: Family Medicine | Admitting: Family Medicine

## 2023-06-29 DIAGNOSIS — Z1231 Encounter for screening mammogram for malignant neoplasm of breast: Secondary | ICD-10-CM

## 2023-10-01 ENCOUNTER — Telehealth: Payer: Self-pay | Admitting: Family Medicine

## 2023-10-29 ENCOUNTER — Other Ambulatory Visit: Payer: Self-pay | Admitting: Family Medicine

## 2023-10-29 DIAGNOSIS — E1142 Type 2 diabetes mellitus with diabetic polyneuropathy: Secondary | ICD-10-CM

## 2023-11-12 ENCOUNTER — Ambulatory Visit: Payer: Medicare Other | Admitting: Family Medicine

## 2023-11-12 DIAGNOSIS — H0100B Unspecified blepharitis left eye, upper and lower eyelids: Secondary | ICD-10-CM | POA: Diagnosis not present

## 2023-11-12 DIAGNOSIS — H0101A Ulcerative blepharitis right eye, upper and lower eyelids: Secondary | ICD-10-CM | POA: Diagnosis not present

## 2023-11-12 DIAGNOSIS — H10233 Serous conjunctivitis, except viral, bilateral: Secondary | ICD-10-CM | POA: Diagnosis not present

## 2023-11-12 DIAGNOSIS — H16143 Punctate keratitis, bilateral: Secondary | ICD-10-CM | POA: Diagnosis not present

## 2023-12-14 ENCOUNTER — Ambulatory Visit (INDEPENDENT_AMBULATORY_CARE_PROVIDER_SITE_OTHER): Payer: Medicare HMO | Admitting: Family Medicine

## 2023-12-14 ENCOUNTER — Encounter: Payer: Self-pay | Admitting: Family Medicine

## 2023-12-14 VITALS — BP 133/73 | HR 71 | Temp 97.9°F | Ht 63.0 in | Wt 208.8 lb

## 2023-12-14 DIAGNOSIS — I152 Hypertension secondary to endocrine disorders: Secondary | ICD-10-CM

## 2023-12-14 DIAGNOSIS — E119 Type 2 diabetes mellitus without complications: Secondary | ICD-10-CM | POA: Insufficient documentation

## 2023-12-14 DIAGNOSIS — E1159 Type 2 diabetes mellitus with other circulatory complications: Secondary | ICD-10-CM

## 2023-12-14 DIAGNOSIS — E1169 Type 2 diabetes mellitus with other specified complication: Secondary | ICD-10-CM | POA: Diagnosis not present

## 2023-12-14 DIAGNOSIS — Z23 Encounter for immunization: Secondary | ICD-10-CM

## 2023-12-14 DIAGNOSIS — E1142 Type 2 diabetes mellitus with diabetic polyneuropathy: Secondary | ICD-10-CM | POA: Diagnosis not present

## 2023-12-14 DIAGNOSIS — H1013 Acute atopic conjunctivitis, bilateral: Secondary | ICD-10-CM | POA: Diagnosis not present

## 2023-12-14 DIAGNOSIS — E785 Hyperlipidemia, unspecified: Secondary | ICD-10-CM

## 2023-12-14 DIAGNOSIS — K219 Gastro-esophageal reflux disease without esophagitis: Secondary | ICD-10-CM

## 2023-12-14 DIAGNOSIS — J301 Allergic rhinitis due to pollen: Secondary | ICD-10-CM | POA: Diagnosis not present

## 2023-12-14 DIAGNOSIS — Z7984 Long term (current) use of oral hypoglycemic drugs: Secondary | ICD-10-CM

## 2023-12-14 DIAGNOSIS — Z8739 Personal history of other diseases of the musculoskeletal system and connective tissue: Secondary | ICD-10-CM | POA: Diagnosis not present

## 2023-12-14 LAB — BAYER DCA HB A1C WAIVED: HB A1C (BAYER DCA - WAIVED): 5.9 % — ABNORMAL HIGH (ref 4.8–5.6)

## 2023-12-14 MED ORDER — METOPROLOL TARTRATE 50 MG PO TABS: ORAL_TABLET | ORAL | 3 refills | Status: DC

## 2023-12-14 MED ORDER — OLOPATADINE HCL 0.2 % OP SOLN
OPHTHALMIC | 99 refills | Status: AC
Start: 2023-12-14 — End: ?

## 2023-12-14 MED ORDER — LISINOPRIL-HYDROCHLOROTHIAZIDE 20-25 MG PO TABS: 1.0000 | ORAL_TABLET | Freq: Every day | ORAL | 3 refills | Status: DC

## 2023-12-14 MED ORDER — COLCHICINE 0.6 MG PO TABS: ORAL_TABLET | ORAL | 3 refills | Status: DC

## 2023-12-14 MED ORDER — OMEPRAZOLE 20 MG PO CPDR: 20.0000 mg | DELAYED_RELEASE_CAPSULE | Freq: Every day | ORAL | 3 refills | Status: DC

## 2023-12-14 MED ORDER — LORATADINE 10 MG PO TABS
10.0000 mg | ORAL_TABLET | Freq: Every day | ORAL | 3 refills | Status: AC
Start: 2023-12-14 — End: ?

## 2023-12-14 MED ORDER — AMLODIPINE BESYLATE 5 MG PO TABS: 5.0000 mg | ORAL_TABLET | Freq: Every day | ORAL | 3 refills | Status: DC

## 2023-12-14 MED ORDER — GLIPIZIDE ER 5 MG PO TB24: 5.0000 mg | ORAL_TABLET | Freq: Every day | ORAL | 3 refills | Status: DC

## 2023-12-14 MED ORDER — ATORVASTATIN CALCIUM 20 MG PO TABS: 20.0000 mg | ORAL_TABLET | Freq: Every evening | ORAL | 3 refills | Status: DC

## 2023-12-14 MED ORDER — JENTADUETO XR 5-1000 MG PO TB24: 1.0000 | ORAL_TABLET | Freq: Every day | ORAL | 3 refills | Status: DC

## 2023-12-14 MED ORDER — GABAPENTIN 300 MG PO CAPS: ORAL_CAPSULE | ORAL | 3 refills | Status: DC

## 2023-12-14 NOTE — Progress Notes (Signed)
Subjective: CC:DM PCP: Raliegh Ip, DO [id number] E Rolin is a 73 y.o. female presenting to clinic today for:  1. Type 2 Diabetes with hypertension, hyperlipidemia:  Compliant with Jentadueto, glipizide, gabapentin, Lipitor, amlodipine and lisinopril-hydrochlorothiazide.  No hypoglycemic episodes.  She has been trying to manage her diet but is not exercising regularly.  Diabetes Health Maintenance Due  Topic Date Due   HEMOGLOBIN A1C  12/13/2023   OPHTHALMOLOGY EXAM  12/14/2023 (Originally 10/16/2023)   FOOT EXAM  06/11/2024    Last A1c:  Lab Results  Component Value Date   HGBA1C 6.6 (H) 06/12/2023    ROS: Denies dizziness, LOC, polyuria, polydipsia, unintended weight loss/gain, foot ulcerations, shortness of breath or chest pain.  2.  Itchy eyes She reports that she had scratched her eye so much that she actually ended up developing a secondary infection and saw ophthalmology for this.  They placed her on a combo of antibiotic and steroid drops.  She has completed that and her symptomology has resolved except for she continues to have itchy, puffy eyes occasionally, specifically when she wakes up in the morning.  She is compliant with Claritin.   ROS: Per HPI  No Known Allergies Past Medical History:  Diagnosis Date   Arthritis    Diabetes mellitus without complication (HCC)    Elevated liver enzymes 07/01/2018   Gout of big toe 11/27/2017   Hypertension     Current Outpatient Medications:    acetaminophen (TYLENOL) 500 MG tablet, Take 500 mg by mouth every 6 (six) hours as needed for mild pain or moderate pain., Disp: , Rfl:    Alpha-Lipoic Acid 600 MG CAPS, TAKE ONE CAPSULE ONCE DAILY FOR DIABETIC NEUROPATHY IN FEET, Disp: 90 capsule, Rfl: 0   glucose blood test strip, Test BS daily Dx E11.42 One touch verio, Disp: 100 each, Rfl: 3   amLODipine (NORVASC) 5 MG tablet, Take 1 tablet (5 mg total) by mouth daily., Disp: 90 tablet, Rfl: 3   atorvastatin  (LIPITOR) 20 MG tablet, Take 1 tablet (20 mg total) by mouth every evening., Disp: 90 tablet, Rfl: 3   colchicine 0.6 MG tablet, Day 1 of gout flare up, take 2 tablets by mouth.  Then take 1 tablet daily until gout flare resolved., Disp: 135 tablet, Rfl: 3   gabapentin (NEURONTIN) 300 MG capsule, TAKE ONE CAPSULE THREE TIMES DAILY, Disp: 270 capsule, Rfl: 3   glipiZIDE (GLUCOTROL XL) 5 MG 24 hr tablet, Take 1 tablet (5 mg total) by mouth daily with breakfast., Disp: 90 tablet, Rfl: 3   linaGLIPtin-metFORMIN HCl ER (JENTADUETO XR) 03-999 MG TB24, Take 1 tablet by mouth daily., Disp: 90 tablet, Rfl: 3   lisinopril-hydrochlorothiazide (ZESTORETIC) 20-25 MG tablet, Take 1 tablet by mouth daily., Disp: 90 tablet, Rfl: 3   loratadine (CLARITIN) 10 MG tablet, Take 1 tablet (10 mg total) by mouth daily., Disp: 90 tablet, Rfl: 3   metoprolol tartrate (LOPRESSOR) 50 MG tablet, TAKE ONE TABLET TWICE DAILY, Disp: 180 tablet, Rfl: 3   omeprazole (PRILOSEC) 20 MG capsule, Take 1 capsule (20 mg total) by mouth daily., Disp: 90 capsule, Rfl: 3 Social History   Socioeconomic History   Marital status: Widowed    Spouse name: Not on file   Number of children: 2   Years of education: 9   Highest education level: 9th grade  Occupational History   Occupation: retired  Tobacco Use   Smoking status: Former    Current packs/day: 0.00  Types: Cigarettes    Start date: 06/23/1984    Quit date: 06/24/1999    Years since quitting: 24.4   Smokeless tobacco: Never  Vaping Use   Vaping status: Never Used  Substance and Sexual Activity   Alcohol use: No   Drug use: No   Sexual activity: Not Currently  Other Topics Concern   Not on file  Social History Narrative   Disabled   Her son and grandson live with her   Social Drivers of Health   Financial Resource Strain: Low Risk  (06/18/2023)   Overall Financial Resource Strain (CARDIA)    Difficulty of Paying Living Expenses: Not hard at all  Food Insecurity: No  Food Insecurity (06/18/2023)   Hunger Vital Sign    Worried About Running Out of Food in the Last Year: Never true    Ran Out of Food in the Last Year: Never true  Transportation Needs: No Transportation Needs (06/16/2022)   PRAPARE - Administrator, Civil Service (Medical): No    Lack of Transportation (Non-Medical): No  Physical Activity: Inactive (06/18/2023)   Exercise Vital Sign    Days of Exercise per Week: 0 days    Minutes of Exercise per Session: 0 min  Stress: No Stress Concern Present (06/18/2023)   [employer] of Occupational Health - Occupational Stress Questionnaire    Feeling of Stress : Not at all  Social Connections: Moderately Isolated (06/18/2023)   Social Connection and Isolation Panel [NHANES]    Frequency of Communication with Friends and Family: More than three times a week    Frequency of Social Gatherings with Friends and Family: More than three times a week    Attends Religious Services: More than 4 times per year    Active Member of [employer] or Organizations: No    Attends Banker Meetings: Never    Marital Status: Widowed  Intimate Partner Violence: Not At Risk (06/18/2023)   Humiliation, Afraid, Rape, and Kick questionnaire    Fear of Current or Ex-Partner: No    Emotionally Abused: No    Physically Abused: No    Sexually Abused: No   Family History  Problem Relation Age of Onset   Hypertension Mother    Cancer Mother        colon   Hypertension Father    Hypertension Sister    Congestive Heart Failure Sister    Aneurysm Sister    Hypertension Brother    Heart attack Brother    Pulmonary disease Brother    Breast cancer Neg Hx     Objective: Office vital signs reviewed. BP 133/73   Pulse 71   Temp 97.9 F (36.6 C)   Ht 5\' 3"  (1.6 m)   Wt 208 lb 12.8 oz (94.7 kg)   SpO2 96%   BMI 36.99 kg/m   Physical Examination:  General: Awake, alert, well nourished, No acute distress HEENT: sclera white, MMM.  No gross  tearing or discharge of the eyes appreciated Cardio: regular rate and rhythm, S1S2 heard, no murmurs appreciated Pulm: clear to auscultation bilaterally, no wheezes, rhonchi or rales; normal work of breathing on room air   Assessment/ Plan: 73 y.o. female   Diabetes mellitus treated with oral medication (HCC) - Plan: Microalbumin / creatinine urine ratio, Bayer DCA Hb A1c Waived, CMP14+EGFR, CBC, glipiZIDE (GLUCOTROL XL) 5 MG 24 hr tablet, linaGLIPtin-metFORMIN HCl ER (JENTADUETO XR) 03-999 MG TB24  Hyperlipidemia associated with type 2 diabetes mellitus (HCC) - Plan:  CMP14+EGFR, Lipid Panel, atorvastatin (LIPITOR) 20 MG tablet  Diabetic polyneuropathy associated with type 2 diabetes mellitus (HCC) - Plan: CBC, gabapentin (NEURONTIN) 300 MG capsule  Hypertension associated with diabetes (HCC) - Plan: CMP14+EGFR, lisinopril-hydrochlorothiazide (ZESTORETIC) 20-25 MG tablet, amLODipine (NORVASC) 5 MG tablet, metoprolol tartrate (LOPRESSOR) 50 MG tablet  Gastroesophageal reflux disease without esophagitis - Plan: omeprazole (PRILOSEC) 20 MG capsule  Non-seasonal allergic rhinitis due to pollen - Plan: loratadine (CLARITIN) 10 MG tablet  History of gout - Plan: colchicine 0.6 MG tablet  Encounter for immunization - Plan: Flu Vaccine Trivalent High Dose (Fluad)  Allergic conjunctivitis of both eyes - Plan: Olopatadine HCl 0.2 % SOLN   Sugar under excellent control with A1c of 5.9 today.  Urine microalbumin collected today.  Renal function  She will continue blood pressure and cholesterol medications.  Blood pressure was well-controlled.  Fasting labs collected today.  GABA renewed.  Neuropathy is chronic and stable  PPI, Claritin, colchicine renewed.  We did not discuss these items in detail today  Influenza vaccination administered  Suspect allergic conjunctivitis of the eyes.  Discussed as needed use of Patanol.  Continue Claritin.  Raliegh Ip, DO [facility] [phone]

## 2023-12-15 LAB — MICROALBUMIN / CREATININE URINE RATIO
Creatinine, Urine: 292.8 mg/dL
Microalb/Creat Ratio: 7 mg/g{creat} (ref 0–29)
Microalbumin, Urine: 21.5 ug/mL

## 2023-12-15 LAB — CMP14+EGFR
ALT: 26 [IU]/L (ref 0–32)
AST: 27 [IU]/L (ref 0–40)
Albumin: 4.2 g/dL (ref 3.8–4.8)
Alkaline Phosphatase: 60 [IU]/L (ref 44–121)
BUN/Creatinine Ratio: 16 (ref 12–28)
BUN: 13 mg/dL (ref 8–27)
Bilirubin Total: 0.4 mg/dL (ref 0.0–1.2)
CO2: 23 mmol/L (ref 20–29)
Calcium: 9.8 mg/dL (ref 8.7–10.3)
Chloride: 99 mmol/L (ref 96–106)
Creatinine, Ser: 0.8 mg/dL (ref 0.57–1.00)
Globulin, Total: 2.4 g/dL (ref 1.5–4.5)
Glucose: 171 mg/dL — ABNORMAL HIGH (ref 70–99)
Potassium: 4 mmol/L (ref 3.5–5.2)
Sodium: 137 mmol/L (ref 134–144)
Total Protein: 6.6 g/dL (ref 6.0–8.5)
eGFR: 79 mL/min/{1.73_m2} (ref >59)

## 2023-12-15 LAB — CBC
Hematocrit: 41.1 % (ref 34.0–46.6)
Hemoglobin: 13.6 g/dL (ref 11.1–15.9)
MCH: 28.2 pg (ref 26.6–33.0)
MCHC: 33.1 g/dL (ref 31.5–35.7)
MCV: 85 fL (ref 79–97)
Platelets: 223 10*3/uL (ref 150–450)
RBC: 4.83 x10E6/uL (ref 3.77–5.28)
RDW: 13.2 % (ref 11.7–15.4)
WBC: 4.3 10*3/uL (ref 3.4–10.8)

## 2023-12-15 LAB — LIPID PANEL
Chol/HDL Ratio: 3.4 {ratio} (ref 0.0–4.4)
Cholesterol, Total: 164 mg/dL (ref 100–199)
HDL: 48 mg/dL (ref >39)
LDL Chol Calc (NIH): 95 mg/dL (ref 0–99)
Triglycerides: 117 mg/dL (ref 0–149)
VLDL Cholesterol Cal: 21 mg/dL (ref 5–40)

## 2024-01-15 DIAGNOSIS — E119 Type 2 diabetes mellitus without complications: Secondary | ICD-10-CM | POA: Diagnosis not present

## 2024-01-15 DIAGNOSIS — H524 Presbyopia: Secondary | ICD-10-CM | POA: Diagnosis not present

## 2024-01-15 DIAGNOSIS — H25813 Combined forms of age-related cataract, bilateral: Secondary | ICD-10-CM | POA: Diagnosis not present

## 2024-01-15 DIAGNOSIS — H5203 Hypermetropia, bilateral: Secondary | ICD-10-CM | POA: Diagnosis not present

## 2024-01-15 DIAGNOSIS — H52223 Regular astigmatism, bilateral: Secondary | ICD-10-CM | POA: Diagnosis not present

## 2024-02-09 ENCOUNTER — Other Ambulatory Visit: Payer: Self-pay | Admitting: Family Medicine

## 2024-02-09 DIAGNOSIS — E1142 Type 2 diabetes mellitus with diabetic polyneuropathy: Secondary | ICD-10-CM

## 2024-03-07 ENCOUNTER — Telehealth: Payer: Self-pay | Admitting: Family Medicine

## 2024-05-12 ENCOUNTER — Telehealth: Payer: Self-pay | Admitting: Family Medicine

## 2024-06-02 ENCOUNTER — Telehealth: Payer: Self-pay | Admitting: Family Medicine

## 2024-06-14 ENCOUNTER — Encounter: Payer: Self-pay | Admitting: Family Medicine

## 2024-06-14 ENCOUNTER — Ambulatory Visit (INDEPENDENT_AMBULATORY_CARE_PROVIDER_SITE_OTHER): Payer: Medicare Other | Admitting: Family Medicine

## 2024-06-14 ENCOUNTER — Ambulatory Visit (INDEPENDENT_AMBULATORY_CARE_PROVIDER_SITE_OTHER): Admitting: *Deleted

## 2024-06-14 VITALS — BP 139/64 | HR 60 | Temp 98.2°F | Ht 63.0 in | Wt 210.0 lb

## 2024-06-14 DIAGNOSIS — E119 Type 2 diabetes mellitus without complications: Secondary | ICD-10-CM

## 2024-06-14 DIAGNOSIS — E1169 Type 2 diabetes mellitus with other specified complication: Secondary | ICD-10-CM | POA: Diagnosis not present

## 2024-06-14 DIAGNOSIS — E1142 Type 2 diabetes mellitus with diabetic polyneuropathy: Secondary | ICD-10-CM | POA: Diagnosis not present

## 2024-06-14 DIAGNOSIS — Z7984 Long term (current) use of oral hypoglycemic drugs: Secondary | ICD-10-CM

## 2024-06-14 DIAGNOSIS — Z0000 Encounter for general adult medical examination without abnormal findings: Secondary | ICD-10-CM

## 2024-06-14 DIAGNOSIS — I152 Hypertension secondary to endocrine disorders: Secondary | ICD-10-CM

## 2024-06-14 DIAGNOSIS — Z0001 Encounter for general adult medical examination with abnormal findings: Secondary | ICD-10-CM

## 2024-06-14 DIAGNOSIS — R051 Acute cough: Secondary | ICD-10-CM | POA: Diagnosis not present

## 2024-06-14 DIAGNOSIS — E559 Vitamin D deficiency, unspecified: Secondary | ICD-10-CM

## 2024-06-14 DIAGNOSIS — E1159 Type 2 diabetes mellitus with other circulatory complications: Secondary | ICD-10-CM

## 2024-06-14 DIAGNOSIS — E785 Hyperlipidemia, unspecified: Secondary | ICD-10-CM | POA: Diagnosis not present

## 2024-06-14 DIAGNOSIS — Z23 Encounter for immunization: Secondary | ICD-10-CM

## 2024-06-14 LAB — HM DIABETES EYE EXAM

## 2024-06-14 LAB — BAYER DCA HB A1C WAIVED: HB A1C (BAYER DCA - WAIVED): 7 % — ABNORMAL HIGH (ref 4.8–5.6)

## 2024-06-14 MED ORDER — BENZONATATE 100 MG PO CAPS: 100.0000 mg | ORAL_CAPSULE | Freq: Three times a day (TID) | ORAL | 0 refills | Status: DC | PRN

## 2024-06-14 NOTE — Progress Notes (Signed)
 Sheena Graham arrived 06/14/2024 and has given verbal consent to obtain images and complete their overdue diabetic retinal screening.  The images have been sent to an ophthalmologist or optometrist for review and interpretation.  Results will be sent back to Sheena Norene HERO, DO for review.  Patient has been informed they will be contacted when we receive the results via telephone or MyChart

## 2024-06-14 NOTE — Progress Notes (Signed)
 Sheena Graham is a 73 y.o. female presents to office today for annual physical exam examination.    Concerns today include: 1. Type 2 Diabetes with hypertension, hyperlipidemia w/ neuropathy:  She reports that she has been doing well.  No hypoglycemic episodes.  Compliant with all medications.  Has had a gout flare since her last visit but well-controlled with as needed colchicine .  Last eye exam: needs Last foot exam: needs Last A1c:  Lab Results  Component Value Date   HGBA1C 7.0 (H) 06/14/2024   Nephropathy screen indicated?: UTD Last flu, zoster and/or pneumovax:  Immunization History  Administered Date(s) Administered   Fluad Quad(high Dose 65+) 11/27/2017, 09/30/2018, 12/11/2021, 12/12/2022   Fluad Trivalent(High Dose 65+) 12/14/2023   Influenza, High Dose Seasonal PF 11/27/2017, 09/30/2018   Influenza, Seasonal, Injecte, Preservative Fre 09/24/2015   Influenza,inj,Quad PF,6+ Mos 09/11/2016   Influenza-Unspecified 09/24/2015, 11/27/2017, 09/30/2018   Moderna Sars-Covid-2 Vaccination 02/23/2020, 03/22/2020   Pneumococcal Conjugate-13 06/29/2018   Pneumococcal Polysaccharide-23 02/15/2016, 04/14/2017   Tdap 05/01/2014, 06/14/2024   Zoster Recombinant(Shingrix ) 12/11/2021, 06/10/2022    ROS: No chest pain, shortness of breath, blurred vision.  No polydipsia or polyuria.  2.  Cough She has had a mild cough that has clear mucus.  No fevers.  No hemoptysis.  No shortness of breath or wheezing.  Sometimes she coughs so hard that she gets cramping in her belly at but this is relieved by use of pickle juice   Occupation: retired, Substance use: none Health Maintenance Due  Topic Date Due   COVID-19 Vaccine (3 - 2024-25 season) 07/26/2023   OPHTHALMOLOGY EXAM  10/16/2023   FOOT EXAM  06/11/2024   Medicare Annual Wellness (AWV)  06/17/2024   Refills needed today: none  Immunization History  Administered Date(s) Administered   Fluad Quad(high Dose 65+) 11/27/2017,  09/30/2018, 12/11/2021, 12/12/2022   Fluad Trivalent(High Dose 65+) 12/14/2023   Influenza, High Dose Seasonal PF 11/27/2017, 09/30/2018   Influenza, Seasonal, Injecte, Preservative Fre 09/24/2015   Influenza,inj,Quad PF,6+ Mos 09/11/2016   Influenza-Unspecified 09/24/2015, 11/27/2017, 09/30/2018   Moderna Sars-Covid-2 Vaccination 02/23/2020, 03/22/2020   Pneumococcal Conjugate-13 06/29/2018   Pneumococcal Polysaccharide-23 02/15/2016, 04/14/2017   Tdap 05/01/2014, 06/14/2024   Zoster Recombinant(Shingrix ) 12/11/2021, 06/10/2022   Past Medical History:  Diagnosis Date   Arthritis    Diabetes mellitus without complication (HCC)    Elevated liver enzymes 07/01/2018   Gout of big toe 11/27/2017   Hypertension    Social History   Socioeconomic History   Marital status: Widowed    Spouse name: Not on file   Number of children: 2   Years of education: 9   Highest education level: 9th grade  Occupational History   Occupation: retired  Tobacco Use   Smoking status: Former    Current packs/day: 0.00    Types: Cigarettes    Start date: 06/23/1984    Quit date: 06/24/1999    Years since quitting: 24.9   Smokeless tobacco: Never  Vaping Use   Vaping status: Never Used  Substance and Sexual Activity   Alcohol use: No   Drug use: No   Sexual activity: Not Currently  Other Topics Concern   Not on file  Social History Narrative   Disabled   Her son and grandson live with her   Social Drivers of Health   Financial Resource Strain: Low Risk  (06/18/2023)   Overall Financial Resource Strain (CARDIA)    Difficulty of Paying Living Expenses: Not hard at all  Food Insecurity: No Food Insecurity (06/18/2023)   Hunger Vital Sign    Worried About Running Out of Food in the Last Year: Never true    Ran Out of Food in the Last Year: Never true  Transportation Needs: No Transportation Needs (06/16/2022)   PRAPARE - Administrator, Civil Service (Medical): No    Lack of  Transportation (Non-Medical): No  Physical Activity: Inactive (06/18/2023)   Exercise Vital Sign    Days of Exercise per Week: 0 days    Minutes of Exercise per Session: 0 min  Stress: No Stress Concern Present (06/18/2023)   [employer] of Occupational Health - Occupational Stress Questionnaire    Feeling of Stress : Not at all  Social Connections: Moderately Isolated (06/18/2023)   Social Connection and Isolation Panel    Frequency of Communication with Friends and Family: More than three times a week    Frequency of Social Gatherings with Friends and Family: More than three times a week    Attends Religious Services: More than 4 times per year    Active Member of [employer] or Organizations: No    Attends Banker Meetings: Never    Marital Status: Widowed  Intimate Partner Violence: Not At Risk (06/18/2023)   Humiliation, Afraid, Rape, and Kick questionnaire    Fear of Current or Ex-Partner: No    Emotionally Abused: No    Physically Abused: No    Sexually Abused: No   History reviewed. No pertinent surgical history. Family History  Problem Relation Age of Onset   Hypertension Mother    Cancer Mother        colon   Hypertension Father    Hypertension Sister    Congestive Heart Failure Sister    Aneurysm Sister    Hypertension Brother    Heart attack Brother    Pulmonary disease Brother    Breast cancer Neg Hx     Current Outpatient Medications:    acetaminophen  (TYLENOL ) 500 MG tablet, Take 500 mg by mouth every 6 (six) hours as needed for mild pain or moderate pain., Disp: , Rfl:    Alpha-Lipoic Acid 600 MG CAPS, TAKE ONE CAPSULE ONCE DAILY FOR DIABETIC NEUROPATHY IN FEET, Disp: 90 capsule, Rfl: 1   amLODipine  (NORVASC ) 5 MG tablet, Take 1 tablet (5 mg total) by mouth daily., Disp: 90 tablet, Rfl: 3   atorvastatin  (LIPITOR) 20 MG tablet, Take 1 tablet (20 mg total) by mouth every evening., Disp: 90 tablet, Rfl: 3   colchicine  0.6 MG tablet, Day 1 of gout  flare up, take 2 tablets by mouth.  Then take 1 tablet daily until gout flare resolved., Disp: 135 tablet, Rfl: 3   gabapentin  (NEURONTIN ) 300 MG capsule, TAKE ONE CAPSULE THREE TIMES DAILY, Disp: 270 capsule, Rfl: 3   glipiZIDE  (GLUCOTROL  XL) 5 MG 24 hr tablet, Take 1 tablet (5 mg total) by mouth daily with breakfast., Disp: 90 tablet, Rfl: 3   glucose blood test strip, Test BS daily Dx E11.42 One touch verio, Disp: 100 each, Rfl: 3   linaGLIPtin-metFORMIN  HCl ER (JENTADUETO  XR) 03-999 MG TB24, Take 1 tablet by mouth daily., Disp: 90 tablet, Rfl: 3   lisinopril -hydrochlorothiazide  (ZESTORETIC ) 20-25 MG tablet, Take 1 tablet by mouth daily., Disp: 90 tablet, Rfl: 3   loratadine  (CLARITIN ) 10 MG tablet, Take 1 tablet (10 mg total) by mouth daily., Disp: 90 tablet, Rfl: 3   metoprolol  tartrate (LOPRESSOR ) 50 MG tablet, TAKE ONE TABLET TWICE DAILY,  Disp: 180 tablet, Rfl: 3   Olopatadine  HCl 0.2 % SOLN, Instill 1 drop in each eye once daily., Disp: 2.5 mL, Rfl: PRN   omeprazole  (PRILOSEC) 20 MG capsule, Take 1 capsule (20 mg total) by mouth daily., Disp: 90 capsule, Rfl: 3  No Known Allergies   ROS: Review of Systems Pertinent items noted in HPI and remainder of comprehensive ROS otherwise negative.    Physical exam BP 139/64   Pulse 60   Temp 98.2 F (36.8 C)   Ht 5' 3 (1.6 m)   Wt 210 lb (95.3 kg)   SpO2 94%   BMI 37.20 kg/m  General appearance: alert, cooperative, appears stated age, no distress, and morbidly obese Head: Normocephalic, without obvious abnormality, atraumatic Eyes: negative findings: lids and lashes normal, conjunctivae and sclerae normal, corneas clear, and pupils equal, round, reactive to light and accomodation Ears: normal TM's and external ear canals both ears Nose: Nares normal. Septum midline. Mucosa normal. No drainage or sinus tenderness. Throat: Surgically absent teeth.  Oropharynx without masses.  Moist mucous membranes Neck: no adenopathy, supple,  symmetrical, trachea midline, thyroid not enlarged, symmetric, no tenderness/mass/nodules, and heart murmur appreciated into the carotids bilaterally Back: Mild increase in kyphosis but no substantial scoliotic curve appreciated Lungs: clear to auscultation bilaterally Heart: Regular rate and rhythm. 2/6 systolic ejection murmur that radiates to carotids present. Abdomen: Obese, soft, nontender Extremities: extremities normal, atraumatic, no cyanosis or edema Pulses: 2+ and symmetric Skin: Skin color, texture, turgor normal. No rashes or lesions Lymph nodes: Cervical, supraclavicular, and axillary nodes normal. Neurologic: Grossly normal      06/14/2024    9:18 AM 12/14/2023    8:54 AM 06/18/2023    8:20 AM  Depression screen PHQ 2/9  Decreased Interest 0 0 0  Down, Depressed, Hopeless 0 0 0  PHQ - 2 Score 0 0 0  Altered sleeping 0 0 0  Tired, decreased energy 0 0 0  Change in appetite 0 0 0  Feeling bad or failure about yourself  0 0 0  Trouble concentrating 0 0 0  Moving slowly or fidgety/restless 0 0 0  Suicidal thoughts 0 0 0  PHQ-9 Score 0 0 0  Difficult doing work/chores Not difficult at all Not difficult at all Not difficult at all      06/14/2024    9:18 AM 06/17/2023    4:37 PM 12/11/2021   10:14 AM 06/10/2021    9:00 AM  GAD 7 : Generalized Anxiety Score  Nervous, Anxious, on Edge 0 0 0 0  Control/stop worrying 0 0 0 0  Worry too much - different things 0 0 0 0  Trouble relaxing 0 0 0 0  Restless 0 0 0 0  Easily annoyed or irritable 0 0 0 0  Afraid - awful might happen 0 0 0 0  Total GAD 7 Score 0 0 0 0  Anxiety Difficulty Not difficult at all  Not difficult at all Not difficult at all     Assessment/ Plan: Sheena Graham here for annual physical exam.   Annual physical exam - Plan: Tdap vaccine greater than or equal to 7yo IM  Diabetes mellitus treated with oral medication (HCC) - Plan: Bayer DCA Hb A1c Waived, Basic Metabolic Panel  Hyperlipidemia  associated with type 2 diabetes mellitus (HCC)  Diabetic polyneuropathy associated with type 2 diabetes mellitus (HCC)  Hypertension associated with diabetes (HCC) - Plan: Basic Metabolic Panel  Vitamin D  deficiency - Plan: VITAMIN D   25 Hydroxy (Vit-D Deficiency, Fractures)  Acute cough - Plan: benzonatate  (TESSALON  PERLES) 100 MG capsule  Tetanus shot updated.  Sugar remains well-controlled with oral medications.  Check renal function.  Had lipid panel done recently so this was not repeated.  Check vitamin D  level given history of deficiency.  Diabetic eye exam updated.  Plan for diabetic foot exam at next visit  Tessalon  Perles sent for cough.  We discussed red flag signs and symptoms warranting further evaluation she voiced good understanding.  Counseled on healthy lifestyle choices, including diet (rich in fruits, vegetables and lean meats and low in salt and simple carbohydrates) and exercise (at least 30 minutes of moderate physical activity daily).  Patient to follow up 72m for DM  Yacoub Diltz M. Jolinda, DO

## 2024-06-15 ENCOUNTER — Ambulatory Visit: Payer: Self-pay | Admitting: Family Medicine

## 2024-06-15 LAB — BASIC METABOLIC PANEL WITH GFR
BUN/Creatinine Ratio: 13 (ref 12–28)
BUN: 12 mg/dL (ref 8–27)
CO2: 23 mmol/L (ref 20–29)
Calcium: 9.7 mg/dL (ref 8.7–10.3)
Chloride: 99 mmol/L (ref 96–106)
Creatinine, Ser: 0.93 mg/dL (ref 0.57–1.00)
Glucose: 141 mg/dL — ABNORMAL HIGH (ref 70–99)
Potassium: 3.6 mmol/L (ref 3.5–5.2)
Sodium: 142 mmol/L (ref 134–144)
eGFR: 65 mL/min/1.73 (ref >59)

## 2024-06-15 LAB — VITAMIN D 25 HYDROXY (VIT D DEFICIENCY, FRACTURES): Vit D, 25-Hydroxy: 30.2 ng/mL (ref 30.0–100.0)

## 2024-06-17 ENCOUNTER — Ambulatory Visit: Payer: Self-pay | Admitting: Family Medicine

## 2024-06-20 ENCOUNTER — Ambulatory Visit: Payer: Medicare Other

## 2024-06-20 VITALS — BP 139/64 | HR 60 | Ht 63.0 in | Wt 210.0 lb

## 2024-06-20 DIAGNOSIS — Z0000 Encounter for general adult medical examination without abnormal findings: Secondary | ICD-10-CM | POA: Diagnosis not present

## 2024-06-20 NOTE — Progress Notes (Signed)
 Subjective:   Sheena Graham is a 73 y.o. who presents for a Medicare Wellness preventive visit.  As a reminder, Annual Wellness Visits don't include a physical exam, and some assessments may be limited, especially if this visit is performed virtually. We may recommend an in-person follow-up visit with your provider if needed.  Visit Complete: Virtual I connected with  Sheena Graham on 06/20/24 by a audio enabled telemedicine application and verified that I am speaking with the correct person using two identifiers.  Patient Location: Home  Provider Location: Home Office  I discussed the limitations of evaluation and management by telemedicine. The patient expressed understanding and agreed to proceed.  Vital Signs: Because this visit was a virtual/telehealth visit, some criteria may be missing or patient reported. Any vitals not documented were not able to be obtained and vitals that have been documented are patient reported.  VideoDeclined- This patient declined Librarian, academic. Therefore the visit was completed with audio only.  Persons Participating in Visit: Patient.  AWV Questionnaire: No: Patient Medicare AWV questionnaire was not completed prior to this visit.  Cardiac Risk Factors include: advanced age (>24men, >20 women);diabetes mellitus;dyslipidemia;hypertension;obesity (BMI >30kg/m2);smoking/ tobacco exposure     Objective:    Today's Vitals   06/20/24 0811  BP: 139/64  Pulse: 60  Weight: 210 lb (95.3 kg)  Height: 5' 3 (1.6 m)   Body mass index is 37.2 kg/m.     06/20/2024    8:17 AM 06/18/2023    8:21 AM 06/16/2022    8:21 AM 04/04/2020    8:52 AM 03/09/2016    2:09 PM 12/31/2015   11:24 AM  Advanced Directives  Does Patient Have a Medical Advance Directive? No No No No No  No   Would patient like information on creating a medical advance directive?  Yes (MAU/Ambulatory/Procedural Areas - Information given) No - Patient declined  No - Patient declined  No - patient declined information      Data saved with a previous flowsheet row definition    Current Medications (verified) Outpatient Encounter Medications as of 06/20/2024  Medication Sig   acetaminophen  (TYLENOL ) 500 MG tablet Take 500 mg by mouth every 6 (six) hours as needed for mild pain or moderate pain.   Alpha-Lipoic Acid 600 MG CAPS TAKE ONE CAPSULE ONCE DAILY FOR DIABETIC NEUROPATHY IN FEET   amLODipine  (NORVASC ) 5 MG tablet Take 1 tablet (5 mg total) by mouth daily.   atorvastatin  (LIPITOR) 20 MG tablet Take 1 tablet (20 mg total) by mouth every evening.   benzonatate  (TESSALON  PERLES) 100 MG capsule Take 1 capsule (100 mg total) by mouth 3 (three) times daily as needed for cough.   colchicine  0.6 MG tablet Day 1 of gout flare up, take 2 tablets by mouth.  Then take 1 tablet daily until gout flare resolved.   gabapentin  (NEURONTIN ) 300 MG capsule TAKE ONE CAPSULE THREE TIMES DAILY   glipiZIDE  (GLUCOTROL  XL) 5 MG 24 hr tablet Take 1 tablet (5 mg total) by mouth daily with breakfast.   glucose blood test strip Test BS daily Dx E11.42 One touch verio   linaGLIPtin-metFORMIN  HCl ER (JENTADUETO  XR) 03-999 MG TB24 Take 1 tablet by mouth daily.   lisinopril -hydrochlorothiazide  (ZESTORETIC ) 20-25 MG tablet Take 1 tablet by mouth daily.   loratadine  (CLARITIN ) 10 MG tablet Take 1 tablet (10 mg total) by mouth daily.   metoprolol  tartrate (LOPRESSOR ) 50 MG tablet TAKE ONE TABLET TWICE DAILY   Olopatadine   HCl 0.2 % SOLN Instill 1 drop in each eye once daily.   omeprazole  (PRILOSEC) 20 MG capsule Take 1 capsule (20 mg total) by mouth daily.   No facility-administered encounter medications on file as of 06/20/2024.    Allergies (verified) Patient has no known allergies.   History: Past Medical History:  Diagnosis Date   Arthritis    Diabetes mellitus without complication (HCC)    Elevated liver enzymes 07/01/2018   Gout of big toe 11/27/2017   Hypertension     History reviewed. No pertinent surgical history. Family History  Problem Relation Age of Onset   Hypertension Mother    Cancer Mother        colon   Hypertension Father    Hypertension Sister    Congestive Heart Failure Sister    Aneurysm Sister    Hypertension Brother    Heart attack Brother    Pulmonary disease Brother    Breast cancer Neg Hx    Social History   Socioeconomic History   Marital status: Widowed    Spouse name: Not on file   Number of children: 2   Years of education: 9   Highest education level: 9th grade  Occupational History   Occupation: retired  Tobacco Use   Smoking status: Former    Current packs/day: 0.00    Types: Cigarettes    Start date: 06/23/1984    Quit date: 06/24/1999    Years since quitting: 25.0   Smokeless tobacco: Never  Vaping Use   Vaping status: Never Used  Substance and Sexual Activity   Alcohol use: No   Drug use: No   Sexual activity: Not Currently  Other Topics Concern   Not on file  Social History Narrative   Disabled   Her son and grandson live with her   Social Drivers of Health   Financial Resource Strain: Low Risk  (06/20/2024)   Overall Financial Resource Strain (CARDIA)    Difficulty of Paying Living Expenses: Not hard at all  Food Insecurity: No Food Insecurity (06/20/2024)   Hunger Vital Sign    Worried About Running Out of Food in the Last Year: Never true    Ran Out of Food in the Last Year: Never true  Transportation Needs: No Transportation Needs (06/20/2024)   PRAPARE - Administrator, Civil Service (Medical): No    Lack of Transportation (Non-Medical): No  Physical Activity: Inactive (06/20/2024)   Exercise Vital Sign    Days of Exercise per Week: 0 days    Minutes of Exercise per Session: 0 min  Stress: No Stress Concern Present (06/20/2024)   [employer] of Occupational Health - Occupational Stress Questionnaire    Feeling of Stress: Only a little  Social Connections: Moderately  Isolated (06/20/2024)   Social Connection and Isolation Panel    Frequency of Communication with Friends and Family: More than three times a week    Frequency of Social Gatherings with Friends and Family: More than three times a week    Attends Religious Services: More than 4 times per year    Active Member of [employer] or Organizations: No    Attends Banker Meetings: Never    Marital Status: Widowed    Tobacco Counseling Counseling given: Yes    Clinical Intake:  Pre-visit preparation completed: Yes  Pain : No/denies pain     BMI - recorded: 37.2 Nutritional Status: BMI > 30  Obese Nutritional Risks: None Diabetes: Yes  Lab  Results  Component Value Date   HGBA1C 7.0 (H) 06/14/2024   HGBA1C 5.9 (H) 12/14/2023   HGBA1C 6.6 (H) 06/12/2023     How often do you need to have someone help you when you read instructions, pamphlets, or other written materials from your doctor or pharmacy?: 1 - Never  Interpreter Needed?: No  Information entered by :: alia t/cma   Activities of Daily Living     06/20/2024    8:16 AM  In your present state of health, do you have any difficulty performing the following activities:  Hearing? 0  Vision? 0  Difficulty concentrating or making decisions? 0  Walking or climbing stairs? 1  Dressing or bathing? 0  Doing errands, shopping? 0  Preparing Food and eating ? N  Using the Toilet? N  In the past six months, have you accidently leaked urine? N  Do you have problems with loss of bowel control? N  Managing your Medications? N  Managing your Finances? N  Housekeeping or managing your Housekeeping? N    Patient Care Team: Jolinda Norene HERO, DO as PCP - General (Family Medicine) Vicci Mcardle, OD (Optometry)  I have updated your Care Teams any recent Medical Services you may have received from other providers in the past year.     Assessment:   This is a routine wellness examination for Destony.  Hearing/Vision  screen Hearing Screening - Comments:: Pt denies hearing dif Vision Screening - Comments:: Pt wear glasses, [facility] In [place]/last ov 2025   Goals Addressed   None    Depression Screen     06/20/2024    8:20 AM 06/14/2024    9:18 AM 12/14/2023    8:54 AM 06/18/2023    8:20 AM 06/17/2023    4:37 PM 12/12/2022    8:20 AM 06/16/2022    8:18 AM  PHQ 2/9 Scores  PHQ - 2 Score 0 0 0 0 0 0 0  PHQ- 9 Score 0 0 0 0 0 0     Fall Risk     06/20/2024    8:13 AM 12/14/2023    8:53 AM 06/18/2023    8:19 AM 06/12/2023    8:05 AM 12/12/2022    8:20 AM  Fall Risk   Falls in the past year? 0 0 0 0 0  Number falls in past yr: 0 0 0 0 0  Injury with Fall? 0 0 0 0 0  Risk for fall due to : No Fall Risks No Fall Risks No Fall Risks No Fall Risks No Fall Risks  Follow up Falls evaluation completed Falls evaluation completed Falls prevention discussed Education provided Education provided      Data saved with a previous flowsheet row definition    MEDICARE RISK AT HOME:  Medicare Risk at Home Any stairs in or around the home?: No If so, are there any without handrails?: No Home free of loose throw rugs in walkways, pet beds, electrical cords, etc?: Yes Adequate lighting in your home to reduce risk of falls?: Yes Life alert?: No Use of a cane, walker or w/c?: Yes Grab bars in the bathroom?: No Shower chair or bench in shower?: No Elevated toilet seat or a handicapped toilet?: No  TIMED UP AND GO:  Was the test performed?  no  Cognitive Function: 6CIT completed        06/20/2024    8:17 AM 06/18/2023    8:22 AM 06/16/2022    8:21 AM 04/04/2020  8:45 AM  6CIT Screen  What Year? 0 points 0 points 0 points 0 points  What month? 0 points 0 points 0 points 0 points  What time? 0 points 0 points 0 points 0 points  Count back from 20 0 points 0 points 0 points 0 points  Months in reverse 0 points 0 points 4 points 4 points  Repeat phrase 2 points 0 points 4 points 6 points  Total Score  2 points 0 points 8 points 10 points    Immunizations Immunization History  Administered Date(s) Administered   Fluad Quad(high Dose 65+) 11/27/2017, 09/30/2018, 12/11/2021, 12/12/2022   Fluad Trivalent(High Dose 65+) 12/14/2023   Influenza, High Dose Seasonal PF 11/27/2017, 09/30/2018   Influenza, Seasonal, Injecte, Preservative Fre 09/24/2015   Influenza,inj,Quad PF,6+ Mos 09/11/2016   Influenza-Unspecified 09/24/2015, 11/27/2017, 09/30/2018   Moderna Sars-Covid-2 Vaccination 02/23/2020, 03/22/2020   Pneumococcal Conjugate-13 06/29/2018   Pneumococcal Polysaccharide-23 02/15/2016, 04/14/2017   Tdap 05/01/2014, 06/14/2024   Zoster Recombinant(Shingrix ) 12/11/2021, 06/10/2022    Screening Tests Health Maintenance  Topic Date Due   COVID-19 Vaccine (3 - 2024-25 season) 07/26/2023   FOOT EXAM  06/11/2024   INFLUENZA VACCINE  06/24/2024   MAMMOGRAM  06/28/2024   Diabetic kidney evaluation - Urine ACR  12/13/2024   HEMOGLOBIN A1C  12/15/2024   Diabetic kidney evaluation - eGFR measurement  06/14/2025   OPHTHALMOLOGY EXAM  06/14/2025   Medicare Annual Wellness (AWV)  06/20/2025   Fecal DNA (Cologuard)  06/22/2026   DEXA SCAN  06/13/2027   DTaP/Tdap/Td (3 - Td or Tdap) 06/14/2034   Pneumococcal Vaccine: 50+ Years  Completed   Hepatitis C Screening  Completed   Zoster Vaccines- Shingrix   Completed   Hepatitis B Vaccines  Aged Out   HPV VACCINES  Aged Out   Meningococcal B Vaccine  Aged Out    Health Maintenance  Health Maintenance Due  Topic Date Due   COVID-19 Vaccine (3 - 2024-25 season) 07/26/2023   FOOT EXAM  06/11/2024   Health Maintenance Items Addressed: See Nurse Notes at the end of this note  Additional Screening:  Vision Screening: Recommended annual ophthalmology exams for early detection of glaucoma and other disorders of the eye. Would you like a referral to an eye doctor? No    Dental Screening: Recommended annual dental exams for proper oral  hygiene  Community Resource Referral / Chronic Care Management: CRR required this visit?  No   CCM required this visit?  No   Plan:    I have personally reviewed and noted the following in the patient's chart:   Medical and social history Use of alcohol, tobacco or illicit drugs  Current medications and supplements including opioid prescriptions. Patient is not currently taking opioid prescriptions. Functional ability and status Nutritional status Physical activity Advanced directives List of other physicians Hospitalizations, surgeries, and ER visits in previous 12 months Vitals Screenings to include cognitive, depression, and falls Referrals and appointments  In addition, I have reviewed and discussed with patient certain preventive protocols, quality metrics, and best practice recommendations. A written personalized care plan for preventive services as well as general preventive health recommendations were provided to patient.   Ozie Ned, CMA   06/20/2024   After Visit Summary: (MyChart) Due to this being a telephonic visit, the after visit summary with patients personalized plan was offered to patient via MyChart   Notes: Nothing significant to report at this time.

## 2024-06-20 NOTE — Patient Instructions (Signed)
 Sheena Graham , Thank you for taking time out of your busy schedule to complete your Annual Wellness Visit with me. I enjoyed our conversation and look forward to speaking with you again next year. I, as well as your care team,  appreciate your ongoing commitment to your health goals. Please review the following plan we discussed and let me know if I can assist you in the future. Your Game plan/ To Do List    Follow up Visits: Next Medicare AWV with our clinical staff: 06/21/25 at 8:00a.m.   Next Office Visit with your provider: 12/19/24 at 10:30a.m.  Clinician Recommendations:  Aim for 30 minutes of exercise or brisk walking, 6-8 glasses of water, and 5 servings of fruits and vegetables each day.       This is a list of the screening recommended for you and due dates:  Health Maintenance  Topic Date Due   COVID-19 Vaccine (3 - 2024-25 season) 07/26/2023   Medicare Annual Wellness Visit  06/17/2024   Complete foot exam   06/11/2024   Flu Shot  06/24/2024   Mammogram  06/28/2024   Yearly kidney health urinalysis for diabetes  12/13/2024   Hemoglobin A1C  12/15/2024   Yearly kidney function blood test for diabetes  06/14/2025   Eye exam for diabetics  06/14/2025   Cologuard (Stool DNA test)  06/22/2026   DEXA scan (bone density measurement)  06/13/2027   DTaP/Tdap/Td vaccine (3 - Td or Tdap) 06/14/2034   Pneumococcal Vaccine for age over 74  Completed   Hepatitis C Screening  Completed   Zoster (Shingles) Vaccine  Completed   Hepatitis B Vaccine  Aged Out   HPV Vaccine  Aged Out   Meningitis B Vaccine  Aged Out    Advanced directives: (Declined) Advance directive discussed with you today. Even though you declined this today, please call our office should you change your mind, and we can give you the proper paperwork for you to fill out. Advance Care Planning is important because it:  [x]  Makes sure you receive the medical care that is consistent with your values, goals, and  preferences  [x]  It provides guidance to your family and loved ones and reduces their decisional burden about whether or not they are making the right decisions based on your wishes.  Follow the link provided in your after visit summary or read over the paperwork we have mailed to you to help you started getting your Advance Directives in place. If you need assistance in completing these, please reach out to us  so that we can help you!  See attachments for Preventive Care and Fall Prevention Tips.

## 2024-07-01 ENCOUNTER — Other Ambulatory Visit: Payer: Self-pay | Admitting: Family Medicine

## 2024-07-01 DIAGNOSIS — Z1231 Encounter for screening mammogram for malignant neoplasm of breast: Secondary | ICD-10-CM

## 2024-07-04 ENCOUNTER — Ambulatory Visit
Admission: RE | Admit: 2024-07-04 | Discharge: 2024-07-04 | Disposition: A | Discharge status: Home / Self Care | Payer: Medicare Other | Source: Ambulatory Visit | Attending: Family Medicine

## 2024-07-04 DIAGNOSIS — Z1231 Encounter for screening mammogram for malignant neoplasm of breast: Secondary | ICD-10-CM

## 2024-08-17 ENCOUNTER — Encounter: Payer: Medicare HMO | Admitting: Family Medicine

## 2024-12-19 ENCOUNTER — Ambulatory Visit: Payer: Self-pay | Admitting: Family Medicine

## 2024-12-20 ENCOUNTER — Ambulatory Visit: Admitting: Family Medicine

## 2024-12-22 ENCOUNTER — Ambulatory Visit (INDEPENDENT_AMBULATORY_CARE_PROVIDER_SITE_OTHER): Admitting: Family Medicine

## 2024-12-22 ENCOUNTER — Encounter: Payer: Self-pay | Admitting: Family Medicine

## 2024-12-22 ENCOUNTER — Ambulatory Visit

## 2024-12-22 VITALS — BP 166/78 | HR 69 | Temp 97.7°F | Ht 63.0 in | Wt 214.0 lb

## 2024-12-22 DIAGNOSIS — E785 Hyperlipidemia, unspecified: Secondary | ICD-10-CM

## 2024-12-22 DIAGNOSIS — E1169 Type 2 diabetes mellitus with other specified complication: Secondary | ICD-10-CM | POA: Diagnosis not present

## 2024-12-22 DIAGNOSIS — Z780 Asymptomatic menopausal state: Secondary | ICD-10-CM

## 2024-12-22 DIAGNOSIS — Z8739 Personal history of other diseases of the musculoskeletal system and connective tissue: Secondary | ICD-10-CM | POA: Diagnosis not present

## 2024-12-22 DIAGNOSIS — Z13820 Encounter for screening for osteoporosis: Secondary | ICD-10-CM

## 2024-12-22 DIAGNOSIS — Z7984 Long term (current) use of oral hypoglycemic drugs: Secondary | ICD-10-CM | POA: Diagnosis not present

## 2024-12-22 DIAGNOSIS — E119 Type 2 diabetes mellitus without complications: Secondary | ICD-10-CM

## 2024-12-22 DIAGNOSIS — E1142 Type 2 diabetes mellitus with diabetic polyneuropathy: Secondary | ICD-10-CM

## 2024-12-22 DIAGNOSIS — Z23 Encounter for immunization: Secondary | ICD-10-CM

## 2024-12-22 DIAGNOSIS — I152 Hypertension secondary to endocrine disorders: Secondary | ICD-10-CM

## 2024-12-22 DIAGNOSIS — K219 Gastro-esophageal reflux disease without esophagitis: Secondary | ICD-10-CM

## 2024-12-22 DIAGNOSIS — E1159 Type 2 diabetes mellitus with other circulatory complications: Secondary | ICD-10-CM

## 2024-12-22 LAB — CMP14+EGFR
ALT: 36 [IU]/L — ABNORMAL HIGH (ref 0–32)
AST: 41 [IU]/L — ABNORMAL HIGH (ref 0–40)
Albumin: 4.6 g/dL (ref 3.8–4.8)
Alkaline Phosphatase: 79 [IU]/L (ref 49–135)
BUN/Creatinine Ratio: 11 — ABNORMAL LOW (ref 12–28)
BUN: 10 mg/dL (ref 8–27)
Bilirubin Total: 0.4 mg/dL (ref 0.0–1.2)
CO2: 25 mmol/L (ref 20–29)
Calcium: 9.9 mg/dL (ref 8.7–10.3)
Chloride: 97 mmol/L (ref 96–106)
Creatinine, Ser: 0.89 mg/dL (ref 0.57–1.00)
Globulin, Total: 2.7 g/dL (ref 1.5–4.5)
Glucose: 189 mg/dL — ABNORMAL HIGH (ref 70–99)
Potassium: 4.4 mmol/L (ref 3.5–5.2)
Sodium: 139 mmol/L (ref 134–144)
Total Protein: 7.3 g/dL (ref 6.0–8.5)
eGFR: 68 mL/min/{1.73_m2}

## 2024-12-22 LAB — BAYER DCA HB A1C WAIVED: HB A1C (BAYER DCA - WAIVED): 9 % — ABNORMAL HIGH (ref 4.8–5.6)

## 2024-12-22 MED ORDER — JENTADUETO XR 5-1000 MG PO TB24: 1.0000 | ORAL_TABLET | Freq: Every day | ORAL | 3 refills | Status: AC

## 2024-12-22 MED ORDER — AMLODIPINE BESYLATE 10 MG PO TABS: 10.0000 mg | ORAL_TABLET | Freq: Every day | ORAL | 3 refills | Status: AC

## 2024-12-22 MED ORDER — METOPROLOL TARTRATE 50 MG PO TABS: ORAL_TABLET | ORAL | 3 refills | Status: AC

## 2024-12-22 MED ORDER — GABAPENTIN 300 MG PO CAPS: ORAL_CAPSULE | ORAL | 3 refills | Status: AC

## 2024-12-22 MED ORDER — GLIPIZIDE ER 5 MG PO TB24: 5.0000 mg | ORAL_TABLET | Freq: Every day | ORAL | 3 refills | Status: AC

## 2024-12-22 MED ORDER — OMEPRAZOLE 20 MG PO CPDR: 20.0000 mg | DELAYED_RELEASE_CAPSULE | Freq: Every day | ORAL | 3 refills | Status: AC

## 2024-12-22 MED ORDER — ALPHA-LIPOIC ACID 600 MG PO CAPS: 1.0000 | ORAL_CAPSULE | Freq: Every day | ORAL | 3 refills | Status: AC

## 2024-12-22 MED ORDER — ATORVASTATIN CALCIUM 20 MG PO TABS: 20.0000 mg | ORAL_TABLET | Freq: Every evening | ORAL | 3 refills | Status: AC

## 2024-12-22 MED ORDER — COLCHICINE 0.6 MG PO TABS: ORAL_TABLET | ORAL | 3 refills | Status: AC

## 2024-12-22 MED ORDER — LISINOPRIL-HYDROCHLOROTHIAZIDE 20-25 MG PO TABS: 1.0000 | ORAL_TABLET | Freq: Every day | ORAL | 3 refills | Status: AC

## 2024-12-22 NOTE — Progress Notes (Signed)
 "  Subjective: CC:DM PCP: Jolinda Norene HERO, DO YEP:Sheena Graham is a 74 y.o. female presenting to clinic today for:  Type 2 Diabetes with hypertension, hyperlipidemia diabetic neuropathy:  Not checking blood sugar she admits that she has not taken her Jentadueto  for the last few months because she ran out and her current pharmacy would no longer take her insurance for this medicine.  She is compliant with glipizide , amlodipine , lisinopril -HCTZ and atorvastatin .  Denies any chest pain, shortness of breath, visual disturbance or edema.  Neuropathy is chronic and stable and treated with both alpha lipoic acid and gabapentin .  Needs refills on her gabapentin    Diabetes Health Maintenance Due  Topic Date Due   FOOT EXAM  06/11/2024   HEMOGLOBIN A1C  12/15/2024   OPHTHALMOLOGY EXAM  06/14/2025    ROS: Per HPI  Allergies[1] Past Medical History:  Diagnosis Date   Arthritis    Diabetes mellitus without complication (HCC)    Elevated liver enzymes 07/01/2018   Gout of big toe 11/27/2017   Hypertension    Current Medications[2] Social History   Socioeconomic History   Marital status: Widowed    Spouse name: Not on file   Number of children: 2   Years of education: 9   Highest education level: 9th grade  Occupational History   Occupation: retired  Tobacco Use   Smoking status: Former    Current packs/day: 0.00    Types: Cigarettes    Start date: 06/23/1984    Quit date: 06/24/1999    Years since quitting: 25.5   Smokeless tobacco: Never  Vaping Use   Vaping status: Never Used  Substance and Sexual Activity   Alcohol use: No   Drug use: No   Sexual activity: Not Currently  Other Topics Concern   Not on file  Social History Narrative   Disabled   Her son and grandson live with her   Social Drivers of Health   Tobacco Use: Medium Risk (06/20/2024)   Patient History    Smoking Tobacco Use: Former    Smokeless Tobacco Use: Never    Passive Exposure: Not on file   Financial Resource Strain: Low Risk (06/20/2024)   Overall Financial Resource Strain (CARDIA)    Difficulty of Paying Living Expenses: Not hard at all  Food Insecurity: No Food Insecurity (06/20/2024)   Epic    Worried About Radiation Protection Practitioner of Food in the Last Year: Never true    Ran Out of Food in the Last Year: Never true  Transportation Needs: No Transportation Needs (06/20/2024)   Epic    Lack of Transportation (Medical): No    Lack of Transportation (Non-Medical): No  Physical Activity: Inactive (06/20/2024)   Exercise Vital Sign    Days of Exercise per Week: 0 days    Minutes of Exercise per Session: 0 min  Stress: No Stress Concern Present (06/20/2024)   [employer] of Occupational Health - Occupational Stress Questionnaire    Feeling of Stress: Only a little  Social Connections: Moderately Isolated (06/20/2024)   Social Connection and Isolation Panel    Frequency of Communication with Friends and Family: More than three times a week    Frequency of Social Gatherings with Friends and Family: More than three times a week    Attends Religious Services: More than 4 times per year    Active Member of [employer] or Organizations: No    Attends Banker Meetings: Never    Marital Status: Widowed  Intimate  Partner Violence: Not At Risk (06/20/2024)   Epic    Fear of Current or Ex-Partner: No    Emotionally Abused: No    Physically Abused: No    Sexually Abused: No  Depression (PHQ2-9): Low Risk (06/20/2024)   Depression (PHQ2-9)    PHQ-2 Score: 0  Alcohol Screen: Low Risk (06/20/2024)   Alcohol Screen    Last Alcohol Screening Score (AUDIT): 0  Housing: Unknown (06/20/2024)   Epic    Unable to Pay for Housing in the Last Year: No    Number of Times Moved in the Last Year: Not on file    Homeless in the Last Year: No  Utilities: Not At Risk (06/20/2024)   Epic    Threatened with loss of utilities: No  Health Literacy: Adequate Health Literacy (06/20/2024)   B1300 Health  Literacy    Frequency of need for help with medical instructions: Never   Family History  Problem Relation Age of Onset   Hypertension Mother    Cancer Mother        colon   Hypertension Father    Hypertension Sister    Congestive Heart Failure Sister    Aneurysm Sister    Hypertension Brother    Heart attack Brother    Pulmonary disease Brother    Breast cancer Neg Hx     Objective: Office vital signs reviewed. BP (!) 166/78   Pulse 69   Temp 97.7 F (36.5 C)   Ht 5' 3 (1.6 m)   Wt 214 lb (97.1 kg)   SpO2 99%   BMI 37.91 kg/m   Physical Examination:  General: Awake, alert, morbidly obese, No acute distress HEENT: Sclera white.  Moist mucous membranes Cardio: regular rate and rhythm, S1S2 heard, 2/6 systolic ejection murmurs appreciated Pulm: clear to auscultation bilaterally, no wheezes, rhonchi or rales; normal work of breathing on room air MSK: Ambulates with use of assistive device.    Lab Results  Component Value Date   HGBA1C 7.0 (H) 06/14/2024    Assessment/ Plan: 74 y.o. female   Diabetes mellitus treated with oral medication (HCC) - Plan: Bayer DCA Hb A1c Waived, Microalbumin / creatinine urine ratio, CMP14+EGFR, glipiZIDE  (GLUCOTROL  XL) 5 MG 24 hr tablet, linaGLIPtin-metFORMIN  HCl ER (JENTADUETO  XR) 03-999 MG TB24  Diabetic polyneuropathy associated with type 2 diabetes mellitus (HCC) - Plan: CMP14+EGFR, gabapentin  (NEURONTIN ) 300 MG capsule, Alpha-Lipoic Acid 600 MG CAPS  Hypertension associated with diabetes (HCC) - Plan: CMP14+EGFR, amLODipine  (NORVASC ) 10 MG tablet, lisinopril -hydrochlorothiazide  (ZESTORETIC ) 20-25 MG tablet, metoprolol  tartrate (LOPRESSOR ) 50 MG tablet  Hyperlipidemia associated with type 2 diabetes mellitus (HCC) - Plan: CMP14+EGFR, atorvastatin  (LIPITOR) 20 MG tablet  History of gout - Plan: colchicine  0.6 MG tablet  Gastroesophageal reflux disease without esophagitis - Plan: omeprazole  (PRILOSEC) 20 MG  capsule  Post-menopausal - Plan: DG [facility] DEXA  Screening for osteoporosis - Plan: DG [facility] DEXA  Encounter for immunization - Plan: Flu vaccine HIGH DOSE PF(Fluzone Trivalent)  Sugar now grossly uncontrolled with A1c rising to 9.0.  I think this is largely due to diet and noncompliance with Jentadueto  over the last 2 months.  I have renewed this medication and sent it to [facility] pharmacy instead and asked that she contact me if this is not affordable for any reason.  She will continue the glipizide .  Urine microalbumin, renal function checked today.  Blood pressure and also not at goal so I will advance her amlodipine  to 10 mg daily.  She will continue lisinopril   at current dosage but may need to consider advancing the lisinopril  to 40 mg if blood pressure remains uncontrolled.  Continue beta-blocker, statin.  Not due for fasting lipid panel.  Did not discuss gout or GERD today but needed refills so these have been sent.  DEXA also updated today and immunization was provided  Would like to see her back in 3 months for blood pressure and sugar check, sooner if concerns arise  Sheena Hietala CHRISTELLA Fielding, DO [facility] [phone]     [1] No Known Allergies [2]  Current Outpatient Medications:    acetaminophen  (TYLENOL ) 500 MG tablet, Take 500 mg by mouth every 6 (six) hours as needed for mild pain or moderate pain., Disp: , Rfl:    Alpha-Lipoic Acid 600 MG CAPS, TAKE ONE CAPSULE ONCE DAILY FOR DIABETIC NEUROPATHY IN FEET, Disp: 90 capsule, Rfl: 1   amLODipine  (NORVASC ) 5 MG tablet, Take 1 tablet (5 mg total) by mouth daily., Disp: 90 tablet, Rfl: 3   atorvastatin  (LIPITOR) 20 MG tablet, Take 1 tablet (20 mg total) by mouth every evening., Disp: 90 tablet, Rfl: 3   benzonatate  (TESSALON  PERLES) 100 MG capsule, Take 1 capsule (100 mg total) by mouth 3 (three) times daily as needed for cough., Disp: 20 capsule, Rfl: 0   colchicine  0.6 MG tablet, Day 1 of gout flare up,  take 2 tablets by mouth.  Then take 1 tablet daily until gout flare resolved., Disp: 135 tablet, Rfl: 3   gabapentin  (NEURONTIN ) 300 MG capsule, TAKE ONE CAPSULE THREE TIMES DAILY, Disp: 270 capsule, Rfl: 3   glipiZIDE  (GLUCOTROL  XL) 5 MG 24 hr tablet, Take 1 tablet (5 mg total) by mouth daily with breakfast., Disp: 90 tablet, Rfl: 3   glucose blood test strip, Test BS daily Dx E11.42 One touch verio, Disp: 100 each, Rfl: 3   linaGLIPtin-metFORMIN  HCl ER (JENTADUETO  XR) 03-999 MG TB24, Take 1 tablet by mouth daily., Disp: 90 tablet, Rfl: 3   lisinopril -hydrochlorothiazide  (ZESTORETIC ) 20-25 MG tablet, Take 1 tablet by mouth daily., Disp: 90 tablet, Rfl: 3   loratadine  (CLARITIN ) 10 MG tablet, Take 1 tablet (10 mg total) by mouth daily., Disp: 90 tablet, Rfl: 3   metoprolol  tartrate (LOPRESSOR ) 50 MG tablet, TAKE ONE TABLET TWICE DAILY, Disp: 180 tablet, Rfl: 3   Olopatadine  HCl 0.2 % SOLN, Instill 1 drop in each eye once daily., Disp: 2.5 mL, Rfl: PRN   omeprazole  (PRILOSEC) 20 MG capsule, Take 1 capsule (20 mg total) by mouth daily., Disp: 90 capsule, Rfl: 3  "

## 2024-12-22 NOTE — Patient Instructions (Signed)
 Sugar is TOO HIGH.  Please go back on your sugar medication. If you cannot get it/ cannot afford it, call me ASAP. Blood pressure too high. I have increased your Amlodipine  dose today to 10mg . See me back in 3 months.

## 2024-12-23 ENCOUNTER — Ambulatory Visit: Payer: Self-pay | Admitting: Family Medicine

## 2024-12-23 DIAGNOSIS — R748 Abnormal levels of other serum enzymes: Secondary | ICD-10-CM

## 2024-12-23 LAB — MICROALBUMIN / CREATININE URINE RATIO
Creatinine, Urine: 146.5 mg/dL
Microalb/Creat Ratio: 11 mg/g{creat} (ref 0–29)
Microalbumin, Urine: 16.5 ug/mL

## 2025-01-06 ENCOUNTER — Other Ambulatory Visit

## 2025-03-29 ENCOUNTER — Ambulatory Visit: Admitting: Family Medicine

## 2025-06-19 ENCOUNTER — Encounter: Payer: Self-pay | Admitting: Family Medicine

## 2025-06-21 ENCOUNTER — Ambulatory Visit: Payer: Self-pay

## 2025-06-27 ENCOUNTER — Ambulatory Visit
